# Patient Record
Sex: Male | Born: 1950 | Race: White | Hispanic: No | Marital: Married | Smoking: Current every day smoker
Health system: Southern US, Community
[De-identification: ages and names within clinical notes are randomized; demographics above are authoritative.]

## PROBLEM LIST (undated history)

## (undated) DIAGNOSIS — T8859XA Other complications of anesthesia, initial encounter: Secondary | ICD-10-CM

## (undated) DIAGNOSIS — M5137 Other intervertebral disc degeneration, lumbosacral region: Secondary | ICD-10-CM

## (undated) DIAGNOSIS — R51 Headache: Secondary | ICD-10-CM

## (undated) DIAGNOSIS — F419 Anxiety disorder, unspecified: Secondary | ICD-10-CM

## (undated) DIAGNOSIS — M51379 Other intervertebral disc degeneration, lumbosacral region without mention of lumbar back pain or lower extremity pain: Secondary | ICD-10-CM

## (undated) DIAGNOSIS — K219 Gastro-esophageal reflux disease without esophagitis: Secondary | ICD-10-CM

## (undated) DIAGNOSIS — I10 Essential (primary) hypertension: Secondary | ICD-10-CM

## (undated) DIAGNOSIS — R519 Headache, unspecified: Secondary | ICD-10-CM

## (undated) DIAGNOSIS — E785 Hyperlipidemia, unspecified: Secondary | ICD-10-CM

## (undated) HISTORY — DX: Hyperlipidemia, unspecified: E78.5

## (undated) HISTORY — PX: HERNIA REPAIR: SHX51

## (undated) HISTORY — DX: Essential (primary) hypertension: I10

## (undated) HISTORY — PX: TONSILLECTOMY AND ADENOIDECTOMY: SHX28

## (undated) HISTORY — PX: EYE SURGERY: SHX253

---

## 2004-09-21 ENCOUNTER — Emergency Department (HOSPITAL_COMMUNITY): Admission: EM | Admit: 2004-09-21 | Discharge: 2004-09-21 | Payer: Self-pay | Admitting: Emergency Medicine

## 2006-12-13 ENCOUNTER — Ambulatory Visit: Payer: Self-pay | Admitting: Ophthalmology

## 2007-09-15 ENCOUNTER — Ambulatory Visit: Payer: Self-pay | Admitting: Internal Medicine

## 2009-05-18 ENCOUNTER — Emergency Department: Payer: Self-pay | Admitting: Emergency Medicine

## 2009-12-09 ENCOUNTER — Encounter: Admission: RE | Admit: 2009-12-09 | Discharge: 2009-12-09 | Payer: Self-pay | Admitting: Internal Medicine

## 2011-03-31 ENCOUNTER — Other Ambulatory Visit: Payer: Self-pay | Admitting: Internal Medicine

## 2011-03-31 DIAGNOSIS — M79604 Pain in right leg: Secondary | ICD-10-CM

## 2011-03-31 DIAGNOSIS — M545 Low back pain: Secondary | ICD-10-CM

## 2011-04-06 ENCOUNTER — Ambulatory Visit
Admission: RE | Admit: 2011-04-06 | Discharge: 2011-04-06 | Disposition: A | Discharge status: Home / Self Care | Payer: BC Managed Care – PPO | Source: Ambulatory Visit | Attending: Internal Medicine | Admitting: Internal Medicine

## 2011-04-06 DIAGNOSIS — M545 Low back pain: Secondary | ICD-10-CM

## 2011-04-06 DIAGNOSIS — M79604 Pain in right leg: Secondary | ICD-10-CM

## 2011-04-25 HISTORY — PX: SPINE SURGERY: SHX786

## 2011-05-12 ENCOUNTER — Encounter (HOSPITAL_COMMUNITY)
Admission: RE | Admit: 2011-05-12 | Discharge: 2011-05-12 | Disposition: A | Discharge status: Home / Self Care | Payer: BC Managed Care – PPO | Source: Ambulatory Visit | Attending: Neurosurgery | Admitting: Neurosurgery

## 2011-05-12 ENCOUNTER — Other Ambulatory Visit (HOSPITAL_COMMUNITY): Payer: Self-pay | Admitting: Neurosurgery

## 2011-05-12 ENCOUNTER — Ambulatory Visit (HOSPITAL_COMMUNITY)
Admission: RE | Admit: 2011-05-12 | Discharge: 2011-05-12 | Disposition: A | Discharge status: Home / Self Care | Payer: BC Managed Care – PPO | Source: Ambulatory Visit | Attending: Neurosurgery | Admitting: Neurosurgery

## 2011-05-12 DIAGNOSIS — Z01812 Encounter for preprocedural laboratory examination: Secondary | ICD-10-CM | POA: Insufficient documentation

## 2011-05-12 DIAGNOSIS — M5126 Other intervertebral disc displacement, lumbar region: Secondary | ICD-10-CM | POA: Insufficient documentation

## 2011-05-12 DIAGNOSIS — Z01818 Encounter for other preprocedural examination: Secondary | ICD-10-CM | POA: Insufficient documentation

## 2011-05-12 DIAGNOSIS — Z01810 Encounter for preprocedural cardiovascular examination: Secondary | ICD-10-CM | POA: Insufficient documentation

## 2011-05-12 LAB — BASIC METABOLIC PANEL
BUN: 14 mg/dL (ref 6–23)
CO2: 30 mEq/L (ref 19–32)
Calcium: 8.8 mg/dL (ref 8.4–10.5)
Chloride: 105 mEq/L (ref 96–112)
Creatinine, Ser: 1.05 mg/dL (ref 0.50–1.35)
GFR calc Af Amer: >60 mL/min (ref >60)
GFR calc non Af Amer: >60 mL/min (ref >60)
Glucose, Bld: 89 mg/dL (ref 70–99)
Potassium: 4.3 mEq/L (ref 3.5–5.1)
Sodium: 140 mEq/L (ref 135–145)

## 2011-05-12 LAB — SURGICAL PCR SCREEN
MRSA, PCR: NEGATIVE
Staphylococcus aureus: NEGATIVE

## 2011-05-12 LAB — CBC
HCT: 38.6 % — ABNORMAL LOW (ref 39.0–52.0)
Hemoglobin: 13.5 g/dL (ref 13.0–17.0)
MCH: 31.5 pg (ref 26.0–34.0)
MCHC: 35 g/dL (ref 30.0–36.0)
MCV: 90 fL (ref 78.0–100.0)
Platelets: 205 10*3/uL (ref 150–400)
RBC: 4.29 MIL/uL (ref 4.22–5.81)
RDW: 13.4 % (ref 11.5–15.5)
WBC: 9.7 10*3/uL (ref 4.0–10.5)

## 2011-05-12 LAB — TYPE AND SCREEN
ABO/RH(D): A POS
Antibody Screen: NEGATIVE

## 2011-05-12 LAB — ABO/RH: ABO/RH(D): A POS

## 2011-05-13 ENCOUNTER — Inpatient Hospital Stay (HOSPITAL_COMMUNITY): Payer: BC Managed Care – PPO

## 2011-05-13 ENCOUNTER — Observation Stay (HOSPITAL_COMMUNITY)
Admission: RE | Admit: 2011-05-13 | Discharge: 2011-05-14 | Disposition: A | Discharge status: Home / Self Care | Payer: BC Managed Care – PPO | Source: Ambulatory Visit | Attending: Neurosurgery | Admitting: Neurosurgery

## 2011-05-13 DIAGNOSIS — G834 Cauda equina syndrome: Secondary | ICD-10-CM | POA: Insufficient documentation

## 2011-05-13 DIAGNOSIS — M48062 Spinal stenosis, lumbar region with neurogenic claudication: Secondary | ICD-10-CM | POA: Insufficient documentation

## 2011-05-13 DIAGNOSIS — F17200 Nicotine dependence, unspecified, uncomplicated: Secondary | ICD-10-CM | POA: Insufficient documentation

## 2011-05-13 DIAGNOSIS — Z01810 Encounter for preprocedural cardiovascular examination: Secondary | ICD-10-CM | POA: Insufficient documentation

## 2011-05-13 DIAGNOSIS — Z01818 Encounter for other preprocedural examination: Secondary | ICD-10-CM | POA: Insufficient documentation

## 2011-05-13 DIAGNOSIS — M5106 Intervertebral disc disorders with myelopathy, lumbar region: Secondary | ICD-10-CM | POA: Insufficient documentation

## 2011-05-13 DIAGNOSIS — I10 Essential (primary) hypertension: Secondary | ICD-10-CM | POA: Insufficient documentation

## 2011-05-13 DIAGNOSIS — Z01812 Encounter for preprocedural laboratory examination: Secondary | ICD-10-CM | POA: Insufficient documentation

## 2011-05-13 DIAGNOSIS — M4716 Other spondylosis with myelopathy, lumbar region: Principal | ICD-10-CM | POA: Insufficient documentation

## 2011-05-17 NOTE — Op Note (Signed)
NAME:  Jose Kline, Jose Kline NO.:  [id number]  MEDICAL RECORD NO.:  [id number]  LOCATION:  3534                         FACILITY:  [facility]  PHYSICIAN:  Hewitt Shorts, M.D.DATE OF BIRTH:  02/12/1951  DATE OF PROCEDURE:  05/13/2011 DATE OF DISCHARGE:                              OPERATIVE REPORT   PREOPERATIVE DIAGNOSES:  Lumbar stenosis, lumbar spondylosis, lumbar degenerative disk disease, neurogenic claudication, and cauda equina syndrome.  POSTOPERATIVE DIAGNOSES:  Lumbar stenosis, lumbar spondylosis, lumbar degenerative disk disease, neurogenic claudication, and cauda equina syndrome.  PROCEDURE:  L1 and L2 decompressive lumbar laminectomy with decompression of the exiting L1-L2 nerve roots and decompression extending laterally including facetectomy and foraminotomy with decompression beyond that required for interbody arthrodesis, L1-2 posterior lumbar interbody arthrodesis with AVS PEEK interbody implant with Vitoss with bone marrow aspirate and Infuse and L1-2 posterolateral arthrodesis with radius posterior screw instrumentation, Vitoss with bone marrow aspirate and Infuse.  SURGEON:  Hewitt Shorts, MD  ASSISTANT:  Coletta Memos, MD  ANESTHESIA:  General endotracheal.  INDICATIONS:  The patient is a 60 year old man who presented with neurogenic claudication with pain in the low back extending down through the buttocks, thighs, and legs bilaterally.  He also had slow urinary stream, changes in his bowel habits, and erectile dysfunction consistent with chronic cauda equina syndrome and decision was made to proceed with decompression and stabilization.  PROCEDURE:  The patient was brought to the operating room and placed under general anesthesia.  The patient was turned to prone position. Lumbar region was prepped with Betadine soap and solution and draped in a sterile fashion.  The midline was infiltrated with local anesthetic with epinephrine.   An x-ray was taken and the L1-2 level was identified. A midline incision was made over the L1-2 level and carried down through the subcutaneous tissue.  Bipolar electrocautery was used to maintain hemostasis.  Dissection was carried down to the lumbar fascia which was incised bilaterally and the paraspinal muscles were dissected from the spinous process and lamina in a subperiosteal fashion.  Self-retaining retractors were placed and x-ray was taken and we identified the L1-2 level.  Laminectomy was performed using double-action rongeurs, high-speed drill, and Kerrison punches.  There was significant ligamentum flavum thickening that was carefully removed and we were able to extend the decompression laterally, rostrally, and caudally as needed and good decompression of the thecal sac was achieved.  Facetectomy was performed and we examined and decompressed the neural foramina bilaterally and identified the exiting L1 and L2 nerve roots to ensure that there were decompressed.  Then with microdissection and microsurgical technique, we proceeded with bilateral diskectomy at the L1-2 level.  The disk was quite degenerated. The annulus was incised.  Disk space was entered and was quite narrowed. We were able to progressively remove the posterior spondylotic overgrowth and then continued diskectomy.  We were then able to gently distract the disk space and continued diskectomy laterally into the lateral portion of the disk space.  We then began to prepare the endplates using curettes to remove the cartilaginous endplate surfaces and measured the height of the intervertebral disk space and selected 7-mm PEEK interbody implants.  The  C-arm fluoroscope was then draped and brought onto the field to guide our placement of pedicle screws at L1 and L2.  Pedicle entry sites were identified bilaterally at each level.  Each of the pedicles was probed.  Bone marrow was aspirated from the vertebral  bodies and injected over 10 mL strip of Vitoss and then each of the pedicles was examined with a ball probe.  Good bony surfaces were found.  Each was tapped with a 4.25-mm tap.  Good threading was found and then we placed 4.75 mm screws bilaterally at each level using 45 mm screws at L1 and 40 mm screws at L2.  Once all four screws were placed, we packed the interbody implants with a combination of Vitoss bone marrow aspirate and Infuse and placed a distracting sizer on the left side and gently retracted the thecal sac and nerve root medially and gently tamped the first implant on the right side which was countersunk.  We then packed the midline of the disk space with small pledget of Infuse and Vitoss with bone marrow aspirate and then again retracted the thecal sac and nerve root medially, gently tamped the second implant on the left side. We then packed additional Vitoss with bone marrow aspirate lateral to each of the implants and dorsal to the implants within the disk space.  We then packed the lateral gutters with Infuse and Vitoss with bone marrow aspirate.  We had previously exposed and decorticated the transverse process of L1 and L2 bilaterally.  We selected 40-mm prelordosed rods.  They were placed in the screw head and secured with locking caps and once all four locking caps were placed, final tightening was performed.  We then selected a medium cross- link, it was secured to the rods and then the lock in the middle was tightened and secured.  It was irrigated on numerous occasions through the procedure, first with saline solution and bacitracin solution.  Good hemostasis was established and confirmed and then we proceeded with closure.  Deep fascia was closed with interrupted undyed #1 Vicryl sutures.  Scarpa fascia was interrupted undyed 2-0 Vicryl sutures. Subcutaneous and subcuticular were closed with inverted 2-0 and 3-0 undyed Vicryl sutures.  Skin was reapproximated  with Dermabond.  The wound was dressed with sterile gauze and 4-inch Hypafix.  Procedure was tolerated well.  The estimated blood loss was 200 mL.  Sponge count correct.  We did use of Cell Saver during the procedure, but the Cell Saver technician did not feel there was sufficient blood loss to process the collected blood.  Following surgery, the patient was returned back to supine position, reversed from the anesthetic, extubated, and transferred to the recovery room for further care.     Hewitt Shorts, M.D.     RWN/MEDQ  D:  05/13/2011  T:  05/14/2011  Job:  [id number]  Electronically Signed by Shirlean Kelly M.D. on 05/17/2011 09:47:55 AM

## 2012-05-12 ENCOUNTER — Encounter (INDEPENDENT_AMBULATORY_CARE_PROVIDER_SITE_OTHER): Payer: Self-pay | Admitting: General Surgery

## 2012-05-12 ENCOUNTER — Ambulatory Visit (INDEPENDENT_AMBULATORY_CARE_PROVIDER_SITE_OTHER): Payer: BC Managed Care – PPO | Admitting: General Surgery

## 2012-05-12 VITALS — BP 115/80 | HR 66 | Temp 97.0°F | Ht 67.0 in | Wt 194.6 lb

## 2012-05-12 DIAGNOSIS — K429 Umbilical hernia without obstruction or gangrene: Secondary | ICD-10-CM

## 2012-05-12 NOTE — Progress Notes (Signed)
Subjective:   hernia at bellybutton  Patient ID: Jose Kline, male   DOB: [birth year], 61 y.o.   MRN: [id number]  HPI Patient is a pleasant 61 year old male referred by Dr. Su Hilt. For about 4 months he has had a gradually increasing lump at his umbilicus. This has caused intermittent pain which has been somewhat severe on the couple of occasions. He has not had any nausea or vomiting or other GI complaints. There are no particular exacerbating or relieving factors. He has not had any surgery in this area previously or any other abdominal surgery.  Past Medical History  Diagnosis Date  . Hyperlipidemia   . Hypertension    Past Surgical History  Procedure Date  . Spine surgery 04/2011   Current Outpatient Prescriptions  Medication Sig Dispense Refill  . lisinopril-hydrochlorothiazide (PRINZIDE,ZESTORETIC) 20-12.5 MG per tablet       . lovastatin (MEVACOR) 20 MG tablet        No Known Allergies History  Substance Use Topics  . Smoking status: Current Everyday Smoker -- 2.0 packs/day    Types: Cigarettes  . Smokeless tobacco: Not on file  . Alcohol Use: Yes     rare      Review of Systems  Constitutional: Negative.   HENT: Negative.   Respiratory: Negative.   Cardiovascular: Negative.   Gastrointestinal: Positive for abdominal pain. Negative for diarrhea, constipation and abdominal distention.  Genitourinary: Negative.   Musculoskeletal: Positive for back pain and arthralgias.       Objective:   Physical Exam General: Well-developed in no distress Skin: Warm and dry without rash or infection Lymph nodes: No cervical, supraclavicular, or inguinal nodes palpable Lungs: Clear without wheezing Cardiovascular: Regular rate and rhythm without murmur. No edema. Abdomen: Generally soft and nontender. There is approximately 1.5 cm slightly tender reducible hernia in the umbilicus. No other hernias. No masses or organomegaly. Extremities: No joint swelling or  deformity Neurologic: Alert and oriented. Gait normal.    Assessment:     Symptomatic umbilical hernia. We discussed options and elected to proceed with repair using mesh as an outpatient under general anesthesia. We discussed the nature of the surgery as well as risks of anesthesia, bleeding, infection, and recurrence. All questions Jose Kline.    Plan:     Repair of umbilical hernia with mesh as an outpatient under general anesthesia

## 2012-06-13 ENCOUNTER — Other Ambulatory Visit: Payer: Self-pay | Admitting: Internal Medicine

## 2012-06-13 DIAGNOSIS — R1011 Right upper quadrant pain: Secondary | ICD-10-CM

## 2012-06-19 ENCOUNTER — Ambulatory Visit
Admission: RE | Admit: 2012-06-19 | Discharge: 2012-06-19 | Disposition: A | Discharge status: Home / Self Care | Payer: BC Managed Care – PPO | Source: Ambulatory Visit | Attending: Internal Medicine | Admitting: Internal Medicine

## 2012-06-19 DIAGNOSIS — R1011 Right upper quadrant pain: Secondary | ICD-10-CM

## 2012-08-16 DIAGNOSIS — K429 Umbilical hernia without obstruction or gangrene: Secondary | ICD-10-CM

## 2012-08-22 ENCOUNTER — Telehealth (INDEPENDENT_AMBULATORY_CARE_PROVIDER_SITE_OTHER): Payer: Self-pay

## 2012-08-22 NOTE — Telephone Encounter (Signed)
Post op appt given to patient for 09/14/12 @ 9:00 w/Dr. Johna Sheriff.

## 2012-09-14 ENCOUNTER — Ambulatory Visit (INDEPENDENT_AMBULATORY_CARE_PROVIDER_SITE_OTHER): Payer: BC Managed Care – PPO | Admitting: General Surgery

## 2012-09-14 ENCOUNTER — Encounter (INDEPENDENT_AMBULATORY_CARE_PROVIDER_SITE_OTHER): Payer: Self-pay | Admitting: General Surgery

## 2012-09-14 VITALS — BP 118/70 | HR 60 | Temp 98.2°F | Resp 16 | Ht 69.25 in | Wt 192.4 lb

## 2012-09-14 DIAGNOSIS — Z09 Encounter for follow-up examination after completed treatment for conditions other than malignant neoplasm: Secondary | ICD-10-CM

## 2012-09-14 NOTE — Progress Notes (Signed)
History: Patient returns 3 weeks following repair of his umbilical hernia with a ventral patch. He is back at work. He reports no particular problems were pain.  Exam: The incision is well healed and the repair feels solid.  Assessment and plan: Doing well following umbilical hernia repair without evidence of complication. He is released to full activity and return as needed.

## 2013-11-21 ENCOUNTER — Ambulatory Visit
Admission: RE | Admit: 2013-11-21 | Discharge: 2013-11-21 | Disposition: A | Discharge status: Home / Self Care | Payer: BC Managed Care – PPO | Source: Ambulatory Visit | Attending: Internal Medicine | Admitting: Internal Medicine

## 2013-11-21 ENCOUNTER — Other Ambulatory Visit: Payer: Self-pay | Admitting: Internal Medicine

## 2013-11-21 DIAGNOSIS — R059 Cough, unspecified: Secondary | ICD-10-CM

## 2013-11-21 DIAGNOSIS — R05 Cough: Secondary | ICD-10-CM

## 2014-11-21 ENCOUNTER — Other Ambulatory Visit: Payer: Self-pay | Admitting: Internal Medicine

## 2014-11-21 DIAGNOSIS — R634 Abnormal weight loss: Secondary | ICD-10-CM

## 2014-11-21 DIAGNOSIS — R319 Hematuria, unspecified: Secondary | ICD-10-CM

## 2014-11-25 ENCOUNTER — Ambulatory Visit
Admission: RE | Admit: 2014-11-25 | Discharge: 2014-11-25 | Disposition: A | Discharge status: Home / Self Care | Payer: BC Managed Care – PPO | Source: Ambulatory Visit | Attending: Internal Medicine | Admitting: Internal Medicine

## 2014-11-25 ENCOUNTER — Other Ambulatory Visit: Payer: BC Managed Care – PPO

## 2014-11-25 DIAGNOSIS — R319 Hematuria, unspecified: Secondary | ICD-10-CM

## 2014-11-25 DIAGNOSIS — R634 Abnormal weight loss: Secondary | ICD-10-CM

## 2014-12-10 ENCOUNTER — Other Ambulatory Visit: Payer: Self-pay | Admitting: Physician Assistant

## 2014-12-10 DIAGNOSIS — R630 Anorexia: Secondary | ICD-10-CM

## 2014-12-10 DIAGNOSIS — R634 Abnormal weight loss: Secondary | ICD-10-CM

## 2014-12-10 DIAGNOSIS — R6881 Early satiety: Secondary | ICD-10-CM

## 2014-12-12 ENCOUNTER — Encounter (INDEPENDENT_AMBULATORY_CARE_PROVIDER_SITE_OTHER): Payer: Self-pay

## 2014-12-12 ENCOUNTER — Ambulatory Visit
Admission: RE | Admit: 2014-12-12 | Discharge: 2014-12-12 | Disposition: A | Discharge status: Home / Self Care | Payer: BC Managed Care – PPO | Source: Ambulatory Visit | Attending: Physician Assistant | Admitting: Physician Assistant

## 2014-12-12 DIAGNOSIS — R634 Abnormal weight loss: Secondary | ICD-10-CM

## 2014-12-12 DIAGNOSIS — R630 Anorexia: Secondary | ICD-10-CM

## 2014-12-12 DIAGNOSIS — R6881 Early satiety: Secondary | ICD-10-CM

## 2014-12-12 MED ORDER — IOHEXOL 300 MG/ML  SOLN
100.0000 mL | Freq: Once | INTRAMUSCULAR | Status: AC | PRN
  Administered 2014-12-12: 100 mL via INTRAVENOUS

## 2014-12-16 ENCOUNTER — Other Ambulatory Visit: Payer: Self-pay | Admitting: Physician Assistant

## 2014-12-16 DIAGNOSIS — K551 Chronic vascular disorders of intestine: Secondary | ICD-10-CM

## 2014-12-16 DIAGNOSIS — I771 Stricture of artery: Principal | ICD-10-CM

## 2014-12-25 ENCOUNTER — Ambulatory Visit
Admission: RE | Admit: 2014-12-25 | Discharge: 2014-12-25 | Disposition: A | Discharge status: Home / Self Care | Payer: BC Managed Care – PPO | Source: Ambulatory Visit | Attending: Physician Assistant | Admitting: Physician Assistant

## 2014-12-25 DIAGNOSIS — K551 Chronic vascular disorders of intestine: Secondary | ICD-10-CM

## 2014-12-25 DIAGNOSIS — I771 Stricture of artery: Principal | ICD-10-CM

## 2014-12-25 HISTORY — PX: IR GENERIC HISTORICAL: IMG1180011

## 2014-12-25 MED ORDER — IOHEXOL 350 MG/ML SOLN
75.0000 mL | Freq: Once | INTRAVENOUS | Status: AC | PRN
  Administered 2014-12-25: 75 mL via INTRAVENOUS

## 2014-12-25 NOTE — Consult Note (Signed)
Chief Complaint: Chief Complaint  Patient presents with  . 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  Referring Physician(s): Deliah Goody, Dr. Cristina Gong  History of Present Illness: Jose Kline is a 64 y.o. 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 For further evaluation, dedicated CTA was performed. 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 True lumen is significantly narrowed. No significant ostial stenosis to warrant percutaneous intervention. No current SMA occlusion.  jejunal branches remain patent. He currently is retired but remains very active. 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  Prior history of hypertension and hyperlipidemia. Remote history of neck and back surgeries. Remote umbilical hernia repair.   Past Medical History  Diagnosis Date  . Hyperlipidemia   . Hypertension     Past Surgical History  Procedure Laterality Date  . Spine surgery  04/2011    Allergies: Review of patient's allergies indicates no known allergies.  Medications: Prior to Admission medications   Medication Sig Start Date End Date Taking? Authorizing Provider  lisinopril-hydrochlorothiazide (PRINZIDE,ZESTORETIC) 20-12.5 MG per tablet  03/01/12  Yes Historical Provider, MD  lovastatin (MEVACOR) 20 MG tablet  04/25/12  Yes Historical Provider, MD  omeprazole (PRILOSEC) 40 MG capsule Take 40 mg by mouth daily.   Yes Historical Provider, MD     Family History  Problem Relation Age of Onset  . Cancer Mother     breast  . Kidney disease Mother     History   Social History  . Marital Status: Married    Spouse Name: N/A  . Number of Children: N/A  . Years of Education: N/A   Social History Main Topics  . Smoking status: Current Every Day Smoker -- 2.00 packs/day    Types: Cigarettes     Start date: 12/25/1966  . Smokeless tobacco: Not on file  . Alcohol Use: 0.0 oz/week    0 Standard drinks or equivalent per week     Comment: rare  . Drug Use: No  . Sexual Activity: Not on file   Other Topics Concern  . None   Social History Narrative    Review of Systems: A 12 point ROS discussed and pertinent positives are indicated in the HPI above.  All other systems are negative.  Review of Systems  Constitutional: Positive for appetite change. Negative for fever, activity change and fatigue.  Respiratory: Negative for cough, chest tightness and shortness of breath.   Cardiovascular: Negative for chest pain and palpitations.  Gastrointestinal: Negative for abdominal distention.    Vital Signs: BP 143/85 mmHg  Pulse 57  Temp(Src) 97.5 F (36.4 C) (Oral)  Resp 12  SpO2 98%  Physical Exam  Constitutional: He appears well-developed and well-nourished. No distress.  Neck: No JVD present. No tracheal deviation present. No thyromegaly present.  Negative for carotid bruit.  Cardiovascular: Normal rate, regular rhythm, normal heart sounds and intact distal pulses.  Exam reveals no friction rub.   No murmur heard. Pulmonary/Chest: Effort normal. No respiratory distress. He has wheezes. He exhibits no tenderness.  Abdominal: Soft. Bowel sounds are normal. He exhibits no distension.  No recurrent umbilical hernia.  Skin: Skin is warm and dry. No rash noted. He is not diaphoretic. No erythema. No pallor.  Psychiatric: He has a normal mood and affect. His behavior  is normal. Judgment and thought content normal.    Mallampati Score:   1  Imaging: US Abdomen Complete  11/25/2014   CLINICAL DATA:  Weight loss, blood in urine  EXAM: ULTRASOUND ABDOMEN COMPLETE  COMPARISON:  None.  FINDINGS: Gallbladder: No gallstones or wall thickening visualized. No sonographic Murphy sign noted.  Common bile duct: Diameter: 3.7 mm  Liver: No focal lesion identified. Within normal limits in  parenchymal echogenicity.  IVC: No abnormality visualized.  Pancreas: Visualized portion unremarkable.  Spleen: Size and appearance within normal limits.  Right Kidney: Length: 11.9 cm. Echogenicity within normal limits. No mass or hydronephrosis visualized.  Left Kidney: Length: 10.6 cm. Echogenicity within normal limits. No mass or hydronephrosis visualized.  Abdominal aorta: No aneurysm visualized.  Other findings: No ascites is demonstrated.  IMPRESSION: Normal abdominal ultrasound examination. No renal abnormalities are demonstrated.   Electronically Signed   By: David  Martinique   On: 11/25/2014 11:15   US Pelvis Limited  11/25/2014   CLINICAL DATA:  Hematuria and weight loss  EXAM: LIMITED ULTRASOUND OF PELVIS  TECHNIQUE: Limited transabdominal ultrasound examination of the pelvis was performed.  COMPARISON:  None.  FINDINGS: The urinary bladder is adequately distended. No intraluminal mass is demonstrated. There is no wall thickening or other mural abnormalities.  IMPRESSION: Normal ultrasound limited to the urinary bladder.   Electronically Signed   By: David  Martinique   On: 11/25/2014 11:11   Ct Abdomen Pelvis W Contrast  12/12/2014   CLINICAL DATA:  One month of weight loss and early satiety. Upper abdominal pain.  EXAM: CT ABDOMEN AND PELVIS WITH CONTRAST  TECHNIQUE: Multidetector CT imaging of the abdomen and pelvis was performed using the standard protocol following bolus administration of intravenous contrast.  CONTRAST:  166mL OMNIPAQUE IOHEXOL 300 MG/ML  SOLN  COMPARISON:  None.  FINDINGS: BODY WALL: There is a fatty right inguinal hernia. Status post umbilical hernia repair with mesh.  LOWER CHEST: Unremarkable.  ABDOMEN/PELVIS:  Liver: Small hypervascular focus in the upper left liver, likely a sub cm shunt. There is no indication of cirrhosis.  Biliary: No evidence of biliary obstruction or stone.  Pancreas: Unremarkable.  Spleen: Unremarkable.  Adrenals: Unremarkable.  Kidneys and ureters: There  is a 11 mm mildly dense nodule exophytic from the upper pole left kidney. Although indeterminate on this examination, based on contrast enhanced lumbar spine MRI 09/13/2014 this appears nonenhancing and most compatible with a hemorrhagic or proteinaceous cyst.  Bladder: Unremarkable.  Reproductive: Unremarkable.  Bowel: No obstruction. No wall thickening or congestion.  Retroperitoneum: No mass or adenopathy.  Peritoneum: No ascites or pneumoperitoneum.  Vascular: There is poor flow within the SMA, beginning before the first branching. The appearance is not typical of a focal stenosis, and there may have been prior dissection or diffuse thick atheromatous plaque. A thin crescent of flow is visible in the posterior lumen and absent or poor flow present anteriorly. Contrast timing and streak artifact limits further assessment. There is no indication of bowel ischemia. Extensive atherosclerosis. No aneurysm.  OSSEOUS: Status post L1-2 discectomy with posterior rod and pedicle screw fixation. There is complete interbody fusion. Degenerative disc disease is advanced the lumbar spine, especially at L5-S1.  These results will be called to the ordering clinician or representative by the Radiologist Assistant, and communication documented in the PACS or zVision Dashboard.  IMPRESSION: 1. 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 This could explain the patient history, suggest mesenteric  CTA and interventional radiology clinic referral. 2. Incidental findings are noted above.   Electronically Signed   By: Monte Fantasia M.D.   On: 12/12/2014 09:50   Ct Angio Abd/pel W/ And/or W/o  12/25/2014   CLINICAL DATA:  64 year old male with intermittent left-sided abdominal pain and 20 lb weight loss over the past several months. Evaluate for superior mesenteric artery stenosis. Patient also complains of intermittent diarrhea and constipation.  EXAM: CTA ABDOMEN AND PELVIS wITHOUT  AND WITH CONTRAST  TECHNIQUE: Multidetector CT imaging of the abdomen and pelvis was performed using the standard protocol during bolus administration of intravenous contrast. Multiplanar reconstructed images and MIPs were obtained and reviewed to evaluate the vascular anatomy.  CONTRAST:  35mL OMNIPAQUE IOHEXOL 350 MG/ML SOLN  COMPARISON:  CT abdomen/ pelvis 12/12/2014  FINDINGS: VASCULAR  Aorta: Normal caliber abdominal aorta. There is fairly mild heterogeneous atherosclerotic plaque in the infrarenal segment. Small cluster of focal penetrating ulcers are noted anteriorly from the 11 to 1 o'clock position just above the IMA origin. No aneurysmal dilatation or dissection.  Celiac: Widely patent. Conventional hepatic arterial anatomy. No evidence of visceral artery aneurysm.  SMA: The origin the superior mesenteric artery is widely patent. There is mild fibro fatty plaque approximately 1 cm from the arch origin which results in minimal if any narrowing. However, approximately 1 cm beyond this plaque the vessel remains opacified but irregular with crescentic thrombus in the vessel wall peripherally. After the first 2 main a jejunal branches, the vessel becomes significantly narrowed with the lumen of only 2-3 mm. The origin of the middle colic artery is narrowed. The vessel resumes normal caliber at the origin of the right and ileocolic arteries. The narrowed segment extends for approximately 3- 3.5 cm. 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  Renals: Single renal arteries bilaterally. No evidence of significant stenosis, dissection or aneurysm.  IMA: Moderate narrowing of the origin of the IMA secondary to noncalcified fibro fatty plaque. The remainder the vessel is unremarkable.  Inflow: Trace atherosclerotic calcifications. The vessels remain widely patent. No aneurysmal dilatation or dissection.  Proximal Outflow: Calcified atherosclerotic plaque along the  posterior wall of the right greater than left common femoral arteries. Visualized superficial and profunda femoral arteries are widely patent and relatively disease free.  Veins: No focal venous abnormality. Hepatic, portal and renal veins are patent. SMV and I of the remain patent.  NON-VASCULAR  Lower Chest: The lung bases are clear. Visualized cardiac structures are within normal limits for size. No pericardial effusion. Unremarkable visualized distal thoracic esophagus.  Abdomen: Unremarkable CT appearance of the stomach, duodenum, spleen, adrenal glands and pancreas. There is a small calcified peripancreatic node versus small duodenum diverticulum. Normal hepatic contour and morphology. No discrete hepatic lesion. Gallbladder is unremarkable. No intra or extrahepatic biliary ductal dilatation.  Unremarkable appearance of the bilateral kidneys. No focal solid lesion, hydronephrosis or nephrolithiasis. Stable 11 mm high attenuation proteinaceous or hemorrhagic cyst exophytic from the anteromedial aspect of the left kidney.  No focal bowel wall thickening or evidence of obstruction. Colonic diverticular disease without CT evidence of active inflammation. Normal appendix in the right lower quadrant. No free fluid or suspicious adenopathy.  Pelvis: Right larger than left omental fat containing inguinal hernias. Unremarkable bladder, prostate gland and seminal vesicles. No free fluid or suspicious adenopathy.  Bones/Soft Tissues: No acute fracture or aggressive appearing lytic or blastic osseous lesion. Surgical changes of prior L1-L2 posterior lumbar fusion  with interbody graft. Multilevel degenerative disc disease most notably at L3-L4, L4-L5 and L5-S1. Facet arthropathy is present in the lower lumbar spine at L4-L5 and L5-S1.  Review of the MIP images confirms the above findings.  IMPRESSION: VASCULAR  1. 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 There is associated narrowing of the origin of the middle colic artery. No evidence of vascular occlusion. The false lumen of the dissection is thrombosed. These findings may account for the patient's symptoms of intermittent mesenteric ischemia. 2. Irregular heterogeneous atherosclerotic plaque. Evidence of small penetrating ulcers are noted along the anterior wall of the infrarenal aorta proximal to the origin of the IMA. In combination with the findings of subacute dissection above, this suggests the patient has relatively unstable atherosclerotic plaque. Therefore, further workup and stratification for cardiac risk factors should be considered. Additionally, statin therapy may be helpful for plaque stabilization. 3. Short segment moderate to high-grade stenosis of the origin of the IMA. NON VASCULAR  1. No evidence of ischemic change involving the colon or small bowel. No focal bowel wall thickening. 2. Colonic diverticular disease without CT evidence of active inflammation. 3. Right larger than left bilateral omental fat containing inguinal hernias. 4. Multilevel degenerative disc disease and facet arthropathy in the lumbar spine. 5. Ancillary findings as above without significant interval change. Signed,  Criselda Peaches, MD  [facility]  [facility]   Electronically Signed   By: Jacqulynn Cadet M.D.   On: 12/25/2014 10:33    Assessment and Plan:  Intermittent abdominal pain and 20 pound weight loss over several months. CTA confirms a distal subacute/evolving dissection without occlusion of the jejunal or colic branches. False lumen remains occluded. No current CT signs of mesenteric ischemia .  No significant proximal disease to warrant percutaneous intervention. Findings were discussed at length with the patient  and his wife. They have a clear understanding of the SMA abnormality. Strongly recommend smoking cessation. For the subacute/evolving SMA dissection, I would recommend anticoagulation therapy either Lovenox/Coumadin versus Xarelto for 6 months. Consider follow-up CTA in 6 months.  Thank you for this interesting consult.  I greatly enjoyed meeting NASEAN ZAPF and look forward to participating in their care.  SignedGreggory Keen 12/25/2014, 10:49 AM   I spent a total of  30 Minutes in face to face in clinical consultation, greater than 50% of which was counseling/coordinating care for this patient with and SMA subacute/evolving dissection.

## 2015-11-01 IMAGING — CT CT CTA ABD/PEL W/CM AND/OR W/O CM
4 of 9 series · 11 of 36 positions shown, 15 images · IV contrast (75CC OMNI 350)
Comparison: CT abdomen/ pelvis 12/12/2014

CLINICAL DATA: 63-year-old male with intermittent left-sided
abdominal pain and 20 lb weight loss over the past several months.
Evaluate for superior mesenteric artery stenosis. Patient also
complains of intermittent diarrhea and constipation.

EXAM:
CTA ABDOMEN AND PELVIS wITHOUT AND WITH CONTRAST
TECHNIQUE: Multidetector CT imaging of the abdomen and pelvis was performed
using the standard protocol during bolus administration of
intravenous contrast. Multiplanar reconstructed images and MIPs were
obtained and reviewed to evaluate the vascular anatomy.
CONTRAST:  75mL OMNIPAQUE IOHEXOL 350 MG/ML SOLN

[Series 5: arterial (id) · axial · arterial · 0.78mm/px · z∈[-334,-64]mm · 4 of 181 slices shown]
[im 37/181  soft-tissue]
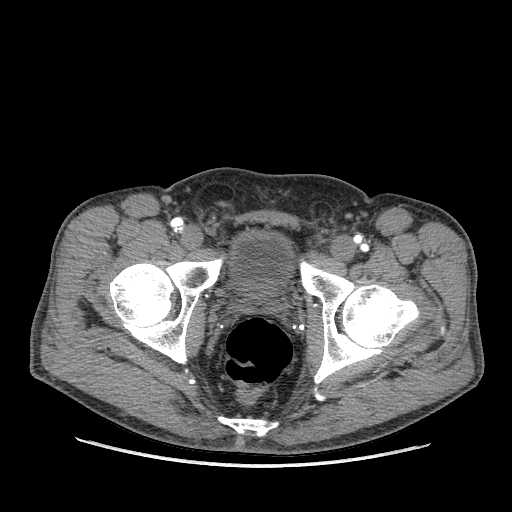
[im 73/181  soft-tissue]
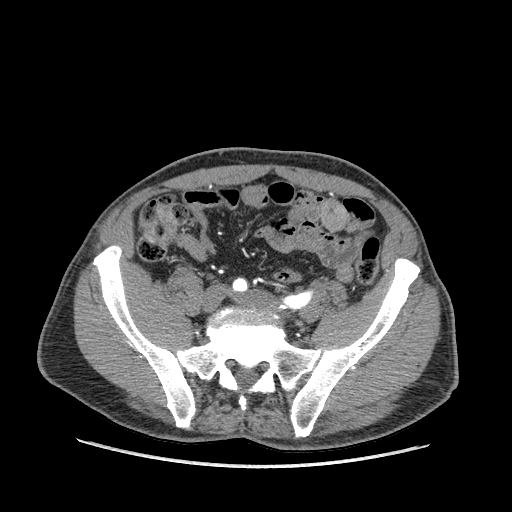
[im 109/181  soft-tissue]
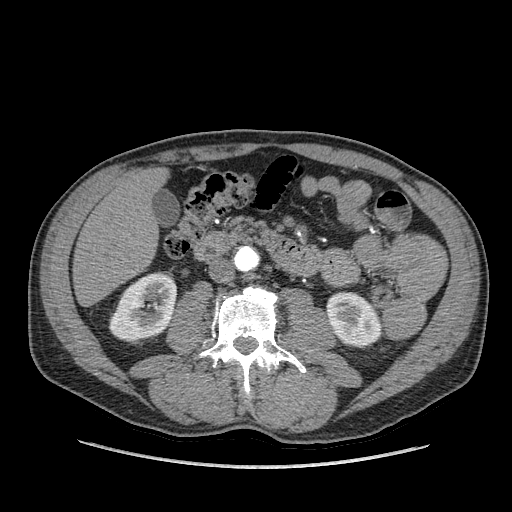
[im 145/181  soft-tissue]
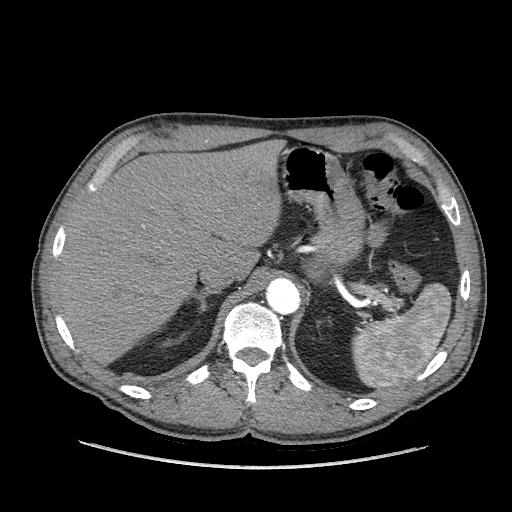

[Series 8: portal venous 5mm · axial · portal-venous · 0.78mm/px · z∈[-429,+26]mm · 3 of 90 slices shown, 7 images]
[im 1/90  soft-tissue]
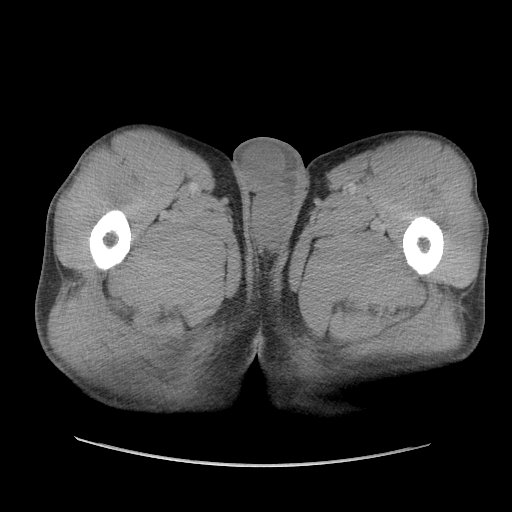
[im 1/90  lung]
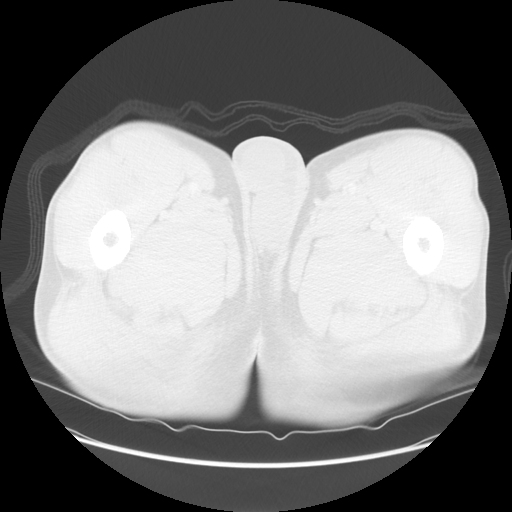
[im 1/90  bone]
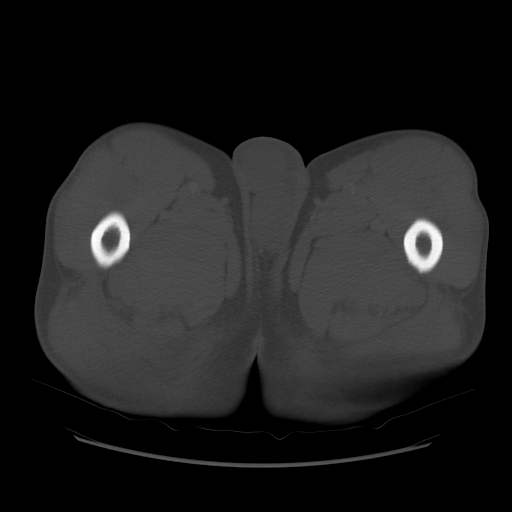
[im 45/90  soft-tissue]
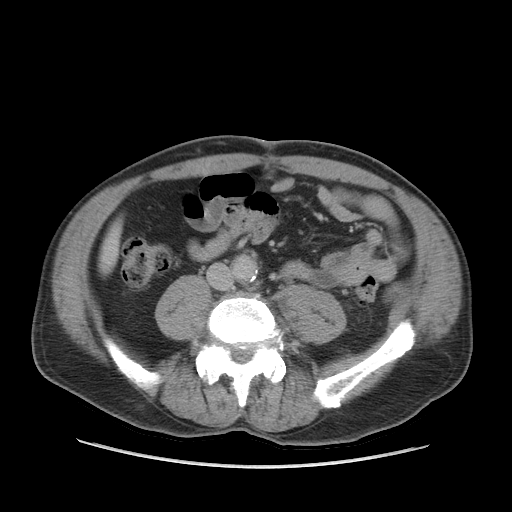
[im 45/90  lung]
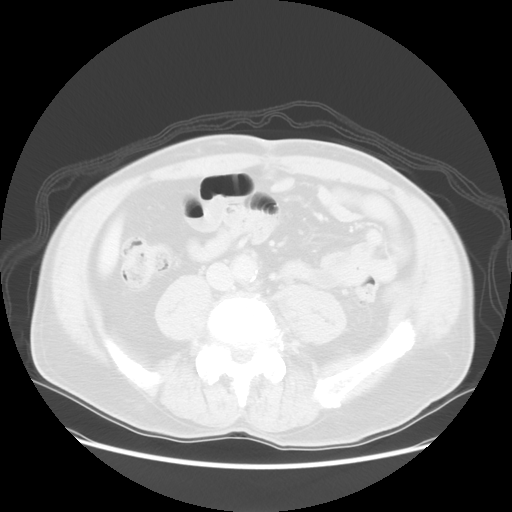
[im 90/90  soft-tissue]
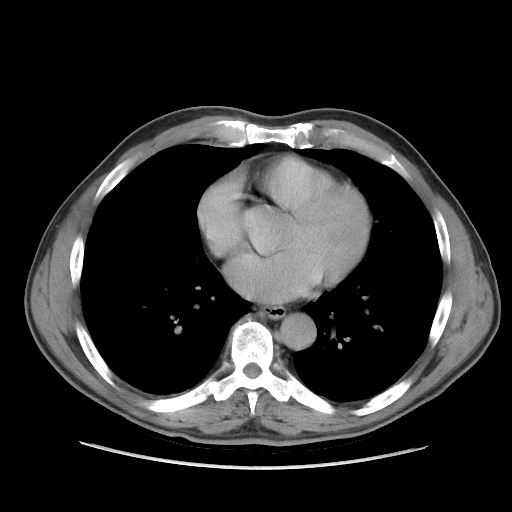
[im 90/90  lung]
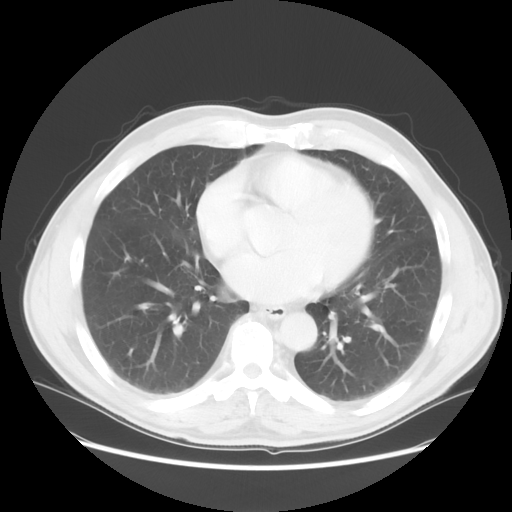

[Series 602: sagittal body · sagittal · 0.90mm/px · 3 of 150 slices shown (1 of 2)]
[im 38/150  soft-tissue]
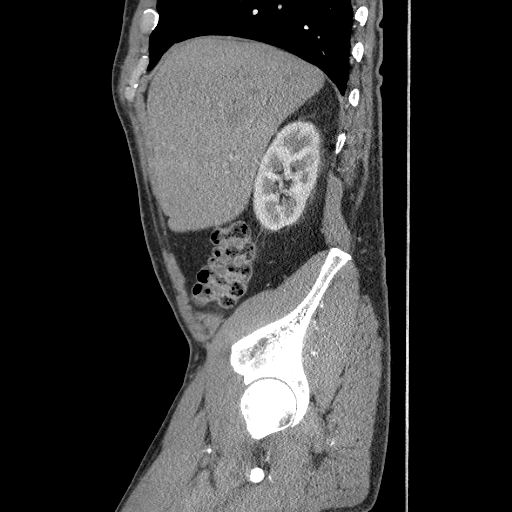
[im 75/150  soft-tissue]
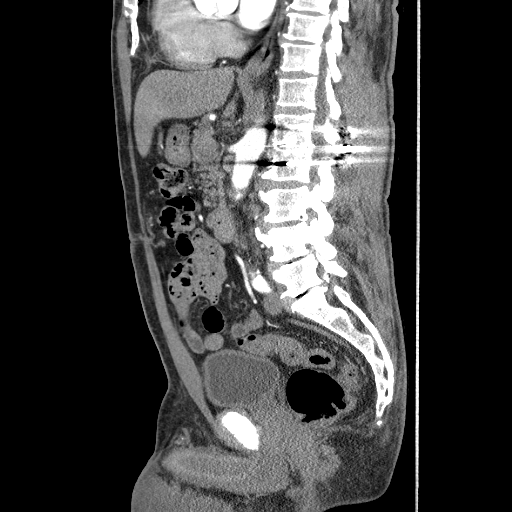
[im 112/150  soft-tissue]
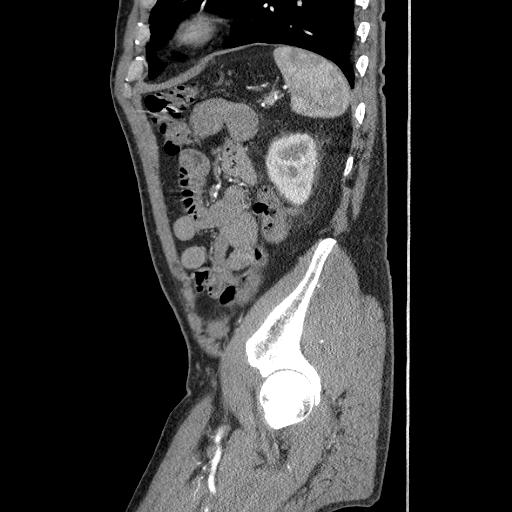

[Series 607: sagittal body · sagittal · 0.90mm/px · 1 of 150 slices shown (2 of 2)]
[im 38/150  soft-tissue]
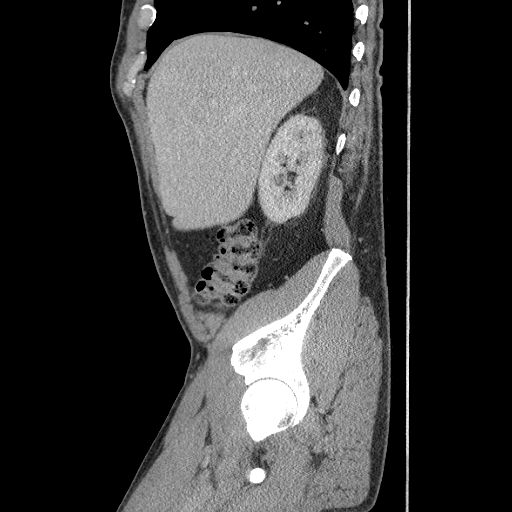

[11 of 36 positions shown; findings below may reference images not displayed]

FINDINGS: VASCULAR

Aorta: Normal caliber abdominal aorta. There is fairly mild
heterogeneous atherosclerotic plaque in the infrarenal segment.
Small cluster of focal penetrating ulcers are noted anteriorly from
the 11 to 1 o'clock position just above the IMA origin. No
aneurysmal dilatation or dissection.

Celiac: Widely patent. Conventional hepatic arterial anatomy. No
evidence of visceral artery aneurysm.

SMA: The origin the superior mesenteric artery is widely patent.
There is mild fibro fatty plaque approximately 1 cm from the arch
origin which results in minimal if any narrowing. However,
approximately 1 cm beyond this plaque the vessel remains opacified
but irregular with crescentic thrombus in the vessel wall
peripherally. After the first 2 main a jejunal branches, the vessel
becomes significantly narrowed with the lumen of only 2-3 mm. The
origin of the middle colic artery is narrowed. The vessel resumes
normal caliber at the origin of the right and ileocolic arteries.
The narrowed segment extends for approximately 3- 3.5 cm. Overall,
the findings are consistent with subacute dissection with thrombosis
of the false lumen.

Renals: Single renal arteries bilaterally. No evidence of
significant stenosis, dissection or aneurysm.

IMA: Moderate narrowing of the origin of the IMA secondary to
noncalcified fibro fatty plaque. The remainder the vessel is
unremarkable.

Inflow: Trace atherosclerotic calcifications. The vessels remain
widely patent. No aneurysmal dilatation or dissection.

Proximal Outflow: Calcified atherosclerotic plaque along the
posterior wall of the right greater than left common femoral
arteries. Visualized superficial and profunda femoral arteries are
widely patent and relatively disease free.

Veins: No focal venous abnormality. Hepatic, portal and renal veins
are patent. SMV and I of the remain patent.

NON-VASCULAR

Lower Chest: The lung bases are clear. Visualized cardiac structures
are within normal limits for size. No pericardial effusion.
Unremarkable visualized distal thoracic esophagus.

Abdomen: Unremarkable CT appearance of the stomach, duodenum,
spleen, adrenal glands and pancreas. There is a small calcified
peripancreatic node versus small duodenum diverticulum. Normal
hepatic contour and morphology. No discrete hepatic lesion.
Gallbladder is unremarkable. No intra or extrahepatic biliary ductal
dilatation.

Unremarkable appearance of the bilateral kidneys. No focal solid
lesion, hydronephrosis or nephrolithiasis. Stable 11 mm high
attenuation proteinaceous or hemorrhagic cyst exophytic from the
anteromedial aspect of the left kidney.

No focal bowel wall thickening or evidence of obstruction. Colonic
diverticular disease without CT evidence of active inflammation.
Normal appendix in the right lower quadrant. No free fluid or
suspicious adenopathy.

Pelvis: Right larger than left omental fat containing inguinal
hernias. Unremarkable bladder, prostate gland and seminal vesicles.
No free fluid or suspicious adenopathy.

Bones/Soft Tissues: No acute fracture or aggressive appearing lytic
or blastic osseous lesion. Surgical changes of prior L1-L2 posterior
lumbar fusion with interbody graft. Multilevel degenerative disc
disease most notably at L3-L4, L4-L5 and L5-S1. Facet arthropathy is
present in the lower lumbar spine at L4-L5 and L5-S1.

Review of the MIP images confirms the above findings.
IMPRESSION: VASCULAR

1. CT arteriogram findings are consistent with a subacute/evolving
dissection of the superior mesenteric artery resulting in a moderate
segment of significant vessel narrowing between the last jejunal
branch and the right colic and ileocolic branches. There is
associated narrowing of the origin of the middle colic artery. No
evidence of vascular occlusion. The false lumen of the dissection is
thrombosed. These findings may account for the patient's symptoms of
intermittent mesenteric ischemia.
2. Irregular heterogeneous atherosclerotic plaque. Evidence of small
penetrating ulcers are noted along the anterior wall of the
infrarenal aorta proximal to the origin of the IMA. In combination
with the findings of subacute dissection above, this suggests the
patient has relatively unstable atherosclerotic plaque. Therefore,
further workup and stratification for cardiac risk factors should be
considered. Additionally, statin therapy may be helpful for plaque
stabilization.
3. Short segment moderate to high-grade stenosis of the origin of
the IMA.
NON VASCULAR

1. No evidence of ischemic change involving the colon or small
bowel. No focal bowel wall thickening.
2. Colonic diverticular disease without CT evidence of active
inflammation.
3. Right larger than left bilateral omental fat containing inguinal
hernias.
4. Multilevel degenerative disc disease and facet arthropathy in the
lumbar spine.
5. Ancillary findings as above without significant interval change.

## 2015-11-26 ENCOUNTER — Ambulatory Visit
Admission: RE | Admit: 2015-11-26 | Discharge: 2015-11-26 | Disposition: A | Discharge status: Home / Self Care | Payer: BC Managed Care – PPO | Source: Ambulatory Visit | Attending: Internal Medicine | Admitting: Internal Medicine

## 2015-11-26 ENCOUNTER — Other Ambulatory Visit: Payer: Self-pay | Admitting: Internal Medicine

## 2015-11-26 DIAGNOSIS — R05 Cough: Secondary | ICD-10-CM

## 2015-11-26 DIAGNOSIS — R059 Cough, unspecified: Secondary | ICD-10-CM

## 2016-10-27 ENCOUNTER — Encounter: Payer: Self-pay | Admitting: Interventional Radiology

## 2017-12-16 ENCOUNTER — Ambulatory Visit: Payer: Self-pay | Admitting: General Surgery

## 2017-12-16 NOTE — H&P (View-Only) (Signed)
PATIENT PROFILE: Jose Kline is a 67 y.o. male who presents to the Clinic for consultation at the request of Dr. Edwina Barth for evaluation of right inguinal hernia.  PCP:  Velna Ochs, MD  HISTORY OF PRESENT ILLNESS: Jose Kline reports feeling a right inguinal hernia since 4-5 weeks ago. The patient refer hernia is causing pain on the right inguinal area. Pain does not radiates to other part of the body. Pain comes and goes by itself. When pain comes, changing position may help with the pain. Heavy lifting has made the pain worse but the pain may also present even when not being active. Denies episodes of vomiting, nausea or abdominal distention. Denies fever or chills. Denies previous feelings of inguinal hernia.    PROBLEM LIST:         Problem List  Date Reviewed: 12/12/2017         Noted   Tobacco abuse 12/12/2017   Hernia, inguinal, right 12/12/2017   Hyperlipemia, mixed 12/12/2017   Gastroesophageal reflux disease without esophagitis 12/12/2017   Erectile dysfunction 12/12/2017      GENERAL REVIEW OF SYSTEMS:   General ROS: negative for - chills, fatigue, fever, weight gain. Positive for weight loss Allergy and Immunology ROS: negative for - hives  Hematological and Lymphatic ROS: negative for - bleeding problems or bruising, negative for palpable nodes Endocrine ROS: negative for - heat or cold intolerance, hair changes Respiratory ROS: negative for - cough, shortness of breath or wheezing Cardiovascular ROS: no chest pain or palpitations GI ROS: negative for nausea, vomiting, abdominal pain, diarrhea, constipation. Positive for change in appetite Musculoskeletal ROS: negative for - joint swelling or muscle pain Neurological ROS: negative for - confusion, syncope Dermatological ROS: negative for pruritus and rash Psychiatric: negative for anxiety, depression, Positive for difficulty sleeping  MEDICATIONS: CurrentMedications        Current Outpatient  Medications  Medication Sig Dispense Refill  . ALPRAZolam (XANAX) 0.25 MG tablet Take 0.25 mg by mouth 2 (two) times daily  5  . hydroCHLOROthiazide (HYDRODIURIL) 12.5 MG tablet Take 12.5 mg by mouth once daily  1  . lovastatin (MEVACOR) 20 MG tablet Take 20 mg by mouth once daily    . omeprazole (PRILOSEC) 40 MG DR capsule Take 40 mg by mouth once daily    . sildenafil, antihypertensive, (REVATIO) 20 mg tablet Take 40 mg by mouth once daily As needed for ED  99   No current facility-administered medications for this visit.       ALLERGIES: Patient has no known allergies.  PAST MEDICAL HISTORY:     Past Medical History:  Diagnosis Date  . Anxiety, unspecified   . Depression, unspecified   . Hyperlipidemia, unspecified   . Hypertension     PAST SURGICAL HISTORY:      Past Surgical History:  Procedure Laterality Date  . ADENOIDECTOMY  1963  . INGUINAL HERNIA REPAIR  01/2014   Patient refers umbilical hernia repairs. Patient denies previous umbilical hernia repair.   FAMILY HISTORY:      Family History  Problem Relation Age of Onset  . Cancer Mother   . Diabetes type II Mother      SOCIAL HISTORY: Social History        Socioeconomic History  . Marital status: Widowed    Spouse name: Not on file  . Number of children: Not on file  . Years of education: Not on file  . Highest education level: Not on file  Occupational History  .  Not on file  Social Needs  . Financial resource strain: Not on file  . Food insecurity:    Worry: Not on file    Inability: Not on file  . Transportation needs:    Medical: Not on file    Non-medical: Not on file  Tobacco Use  . Smoking status: Current Every Day Smoker  . Smokeless tobacco: Never Used  Substance and Sexual Activity  . Alcohol use: Yes    Alcohol/week: 0.6 oz    Types: 1 Shots of liquor per week    Frequency: Monthly or less    Binge frequency: Less than monthly     Comment: occassionally  . Drug use: Never  . Sexual activity: Not on file  Lifestyle  . Physical activity:    Days per week: Not on file    Minutes per session: Not on file  . Stress: Not on file  Relationships  . Social connections:    Talks on phone: Not on file    Gets together: Not on file    Attends religious service: Not on file    Active member of club or organization: Not on file    Attends meetings of clubs or organizations: Not on file    Relationship status: Not on file  . Intimate partner violence:    Fear of current or ex partner: Not on file    Emotionally abused: Not on file    Physically abused: Not on file    Forced sexual activity: Not on file  Other Topics Concern  . Not on file  Social History Narrative  . Not on file    PHYSICAL EXAM: Vitals:   12/16/17 1322  BP: 144/84  Pulse: 61  Temp: 36.7 C (98.1 F)   Body mass index is 27.44 kg/m. Weight: 77.1 kg (170 lb)   GENERAL: Alert, active, oriented x3  HEENT: Pupils equal reactive to light. Extraocular movements are intact. Sclera clear. Palpebral conjunctiva normal red color.Pharynx clear.  NECK: Supple with no palpable mass and no adenopathy.  LUNGS: Sound clear with no rales rhonchi or wheezes.  HEART: Regular rhythm S1 and S2 without murmur.  ABDOMEN: Soft and depressible, nontender with no palpable mass, no hepatomegaly. Right inguinal hernia, reducible, minimal tenderness. Soft.    EXTREMITIES: Well-developed well-nourished symmetrical with no dependent edema.  NEUROLOGICAL: Awake alert oriented, facial expression symmetrical, moving all extremities.  REVIEW OF DATA: I have reviewed the following data today: No results found for any previous visit.   Old chart reviewed finding the evaluation of the umbilical hernia repair. No evidence of previous inguinal hernia repair. Chest Xray reviewed. No sign of pneumonia or chest pathology.    ASSESSMENT: Jose Kline is a 67 y.o. male presenting for consultation for right inguinal hernia.    The patient presents with a symptomatic, reducible inguinal hernia. Patient was oriented about the diagnosis of inguinal hernia and its implication. The patient was oriented about the treatment alternatives (observation vs surgical repair). Due to patient symptoms, repair is recommended. Patient oriented about the surgical procedure, the use of mesh and its risk of complications such as: infection, bleeding, injury to vas deference, vasculature and testicle, injury to bowel or bladder, and chronic pain. Patient also is an active smoker. Patient oriented about the importance of smoking cessation for the benefit of his health and the increased risk of surgical complications and hernia recurrence. Patient is not willing to stop smoking at this moment. Since the hernia is very symptomatic,  will proceed with hernia repair patient understating the increased risk.   PLAN: 1. Right inguinal hernia repair with mesh on 12/28/17 49505 2. CBC, CMP 3. Internal medicine clearance 4. Avoid heavy lifting or position that may cause the pain on inguinal area 5. Do not take aspirin 5 days prior to surgery.   Patient verbalized understanding, all questions were answered, and were agreeable with the plan outlined above.    Herbert Pun, MD  Electronically signed by Herbert Pun, MD

## 2017-12-16 NOTE — H&P (Signed)
PATIENT PROFILE: Jose Kline is a 67 y.o. male who presents to the Clinic for consultation at the request of Dr. Edwina Barth for evaluation of right inguinal hernia.  PCP:  Velna Ochs, MD  HISTORY OF PRESENT ILLNESS: Jose Kline reports feeling a right inguinal hernia since 4-5 weeks ago. The patient refer hernia is causing pain on the right inguinal area. Pain does not radiates to other part of the body. Pain comes and goes by itself. When pain comes, changing position may help with the pain. Heavy lifting has made the pain worse but the pain may also present even when not being active. Denies episodes of vomiting, nausea or abdominal distention. Denies fever or chills. Denies previous feelings of inguinal hernia.    PROBLEM LIST:         Problem List  Date Reviewed: 12/12/2017         Noted   Tobacco abuse 12/12/2017   Hernia, inguinal, right 12/12/2017   Hyperlipemia, mixed 12/12/2017   Gastroesophageal reflux disease without esophagitis 12/12/2017   Erectile dysfunction 12/12/2017      GENERAL REVIEW OF SYSTEMS:   General ROS: negative for - chills, fatigue, fever, weight gain. Positive for weight loss Allergy and Immunology ROS: negative for - hives  Hematological and Lymphatic ROS: negative for - bleeding problems or bruising, negative for palpable nodes Endocrine ROS: negative for - heat or cold intolerance, hair changes Respiratory ROS: negative for - cough, shortness of breath or wheezing Cardiovascular ROS: no chest pain or palpitations GI ROS: negative for nausea, vomiting, abdominal pain, diarrhea, constipation. Positive for change in appetite Musculoskeletal ROS: negative for - joint swelling or muscle pain Neurological ROS: negative for - confusion, syncope Dermatological ROS: negative for pruritus and rash Psychiatric: negative for anxiety, depression, Positive for difficulty sleeping  MEDICATIONS: CurrentMedications        Current Outpatient  Medications  Medication Sig Dispense Refill  . ALPRAZolam (XANAX) 0.25 MG tablet Take 0.25 mg by mouth 2 (two) times daily  5  . hydroCHLOROthiazide (HYDRODIURIL) 12.5 MG tablet Take 12.5 mg by mouth once daily  1  . lovastatin (MEVACOR) 20 MG tablet Take 20 mg by mouth once daily    . omeprazole (PRILOSEC) 40 MG DR capsule Take 40 mg by mouth once daily    . sildenafil, antihypertensive, (REVATIO) 20 mg tablet Take 40 mg by mouth once daily As needed for ED  99   No current facility-administered medications for this visit.       ALLERGIES: Patient has no known allergies.  PAST MEDICAL HISTORY:     Past Medical History:  Diagnosis Date  . Anxiety, unspecified   . Depression, unspecified   . Hyperlipidemia, unspecified   . Hypertension     PAST SURGICAL HISTORY:      Past Surgical History:  Procedure Laterality Date  . ADENOIDECTOMY  1963  . INGUINAL HERNIA REPAIR  01/2014   Patient refers umbilical hernia repairs. Patient denies previous umbilical hernia repair.   FAMILY HISTORY:      Family History  Problem Relation Age of Onset  . Cancer Mother   . Diabetes type II Mother      SOCIAL HISTORY: Social History        Socioeconomic History  . Marital status: Widowed    Spouse name: Not on file  . Number of children: Not on file  . Years of education: Not on file  . Highest education level: Not on file  Occupational History  .  Not on file  Social Needs  . Financial resource strain: Not on file  . Food insecurity:    Worry: Not on file    Inability: Not on file  . Transportation needs:    Medical: Not on file    Non-medical: Not on file  Tobacco Use  . Smoking status: Current Every Day Smoker  . Smokeless tobacco: Never Used  Substance and Sexual Activity  . Alcohol use: Yes    Alcohol/week: 0.6 oz    Types: 1 Shots of liquor per week    Frequency: Monthly or less    Binge frequency: Less than monthly     Comment: occassionally  . Drug use: Never  . Sexual activity: Not on file  Lifestyle  . Physical activity:    Days per week: Not on file    Minutes per session: Not on file  . Stress: Not on file  Relationships  . Social connections:    Talks on phone: Not on file    Gets together: Not on file    Attends religious service: Not on file    Active member of club or organization: Not on file    Attends meetings of clubs or organizations: Not on file    Relationship status: Not on file  . Intimate partner violence:    Fear of current or ex partner: Not on file    Emotionally abused: Not on file    Physically abused: Not on file    Forced sexual activity: Not on file  Other Topics Concern  . Not on file  Social History Narrative  . Not on file    PHYSICAL EXAM: Vitals:   12/16/17 1322  BP: 144/84  Pulse: 61  Temp: 36.7 C (98.1 F)   Body mass index is 27.44 kg/m. Weight: 77.1 kg (170 lb)   GENERAL: Alert, active, oriented x3  HEENT: Pupils equal reactive to light. Extraocular movements are intact. Sclera clear. Palpebral conjunctiva normal red color.Pharynx clear.  NECK: Supple with no palpable mass and no adenopathy.  LUNGS: Sound clear with no rales rhonchi or wheezes.  HEART: Regular rhythm S1 and S2 without murmur.  ABDOMEN: Soft and depressible, nontender with no palpable mass, no hepatomegaly. Right inguinal hernia, reducible, minimal tenderness. Soft.    EXTREMITIES: Well-developed well-nourished symmetrical with no dependent edema.  NEUROLOGICAL: Awake alert oriented, facial expression symmetrical, moving all extremities.  REVIEW OF DATA: I have reviewed the following data today: No results found for any previous visit.   Old chart reviewed finding the evaluation of the umbilical hernia repair. No evidence of previous inguinal hernia repair. Chest Xray reviewed. No sign of pneumonia or chest pathology.    ASSESSMENT: Jose Kline is a 67 y.o. male presenting for consultation for right inguinal hernia.    The patient presents with a symptomatic, reducible inguinal hernia. Patient was oriented about the diagnosis of inguinal hernia and its implication. The patient was oriented about the treatment alternatives (observation vs surgical repair). Due to patient symptoms, repair is recommended. Patient oriented about the surgical procedure, the use of mesh and its risk of complications such as: infection, bleeding, injury to vas deference, vasculature and testicle, injury to bowel or bladder, and chronic pain. Patient also is an active smoker. Patient oriented about the importance of smoking cessation for the benefit of his health and the increased risk of surgical complications and hernia recurrence. Patient is not willing to stop smoking at this moment. Since the hernia is very symptomatic,  will proceed with hernia repair patient understating the increased risk.   PLAN: 1. Right inguinal hernia repair with mesh on 12/28/17 49505 2. CBC, CMP 3. Internal medicine clearance 4. Avoid heavy lifting or position that may cause the pain on inguinal area 5. Do not take aspirin 5 days prior to surgery.   Patient verbalized understanding, all questions were answered, and were agreeable with the plan outlined above.    Herbert Pun, MD  Electronically signed by Herbert Pun, MD

## 2017-12-19 ENCOUNTER — Ambulatory Visit: Payer: Self-pay | Admitting: General Surgery

## 2017-12-22 ENCOUNTER — Encounter
Admission: RE | Admit: 2017-12-22 | Discharge: 2017-12-22 | Disposition: A | Discharge status: Home / Self Care | Payer: Medicare Other | Source: Ambulatory Visit | Attending: General Surgery | Admitting: General Surgery

## 2017-12-22 ENCOUNTER — Other Ambulatory Visit: Payer: Self-pay

## 2017-12-22 DIAGNOSIS — Z01818 Encounter for other preprocedural examination: Secondary | ICD-10-CM | POA: Insufficient documentation

## 2017-12-22 HISTORY — DX: Gastro-esophageal reflux disease without esophagitis: K21.9

## 2017-12-22 HISTORY — DX: Headache: R51

## 2017-12-22 HISTORY — DX: Anxiety disorder, unspecified: F41.9

## 2017-12-22 HISTORY — DX: Other intervertebral disc degeneration, lumbosacral region: M51.37

## 2017-12-22 HISTORY — DX: Headache, unspecified: R51.9

## 2017-12-22 HISTORY — DX: Other intervertebral disc degeneration, lumbosacral region without mention of lumbar back pain or lower extremity pain: M51.379

## 2017-12-22 NOTE — Patient Instructions (Addendum)
Your procedure is scheduled on: Thursday, MARCH 7  Report to Index  To find out your arrival time please call [phone] between 1PM - 3PM on Wednesday, MARCH 6  Remember: Instructions that are not followed completely may result in serious medical risk,  up to and including death, or upon the discretion of your surgeon and anesthesiologist  your surgery may need to be rescheduled.     _X__ 1. Do not eat food after midnight the night before your procedure.                 No gum chewing, lozengers or hard candies.                  You may drink clear liquids up to 2 hours                 before you are scheduled to arrive for your surgery-                   DO not drink clear liquids within 2 hours of the start of your surgery.                  Clear Liquids include:  water, apple juice without pulp, clear carbohydrate                 drink such as Clearfast of Gartorade, Black Coffee or Tea (Do not add                 anything to coffee or tea).  __X__2.  On the morning of surgery brush your teeth with toothpaste and water,                              you may rinse your mouth with mouthwash if you wish.                                    Do not swallow any toothpaste of mouthwash.     _X__ 3.  No Alcohol for 24 hours before or after surgery.   _X__ 4.  Do Not Smoke or use e-cigarettes For 24 Hours Prior to Your Surgery.                 Do not use any chewable tobacco products for at least 6 hours prior to                 surgery.  ____  5.  Bring all medications with you on the day of surgery if instructed.   ____  6.  Notify your doctor if there is any change in your medical condition      (cold, fever, infections).     Do not wear jewelry, make-up, hairpins, clips or nail polish. Do not wear lotions, powders, or perfumes. You may wear deodorant. Do not shave 48 hours prior to surgery. Men may shave face and neck. Do  not bring valuables to the hospital.    [facility] is not responsible for any belongings or valuables.  Contacts, dentures or bridgework may not be worn into surgery. Leave your suitcase in the car. After surgery it may be brought to your room. For patients admitted to the hospital, discharge time is determined by your treatment team.   Patients discharged the  day of surgery will not be allowed to drive home.   Please read over the following fact sheets that you were given:   PREPARING FOR SURGERY   ____ Take these medicines the morning of surgery with A SIP OF WATER:    1. PRILOSEC, TAKE THE NIGHT BEFORE SURGERY AND THE                             MORNING OF SURGERY   2. XANAX, IF NEEDED  3.   4.  5.  6.  ____ Fleet Enema (as directed)   __X__ Use CHG Soap as directed. IF UNABLE TO TOLERATE THIS SOAP,                   PLEASE USE REGULAR SOAP TO SHOWER.  ____ Use inhalers on the day of surgery  _X___ Stop ALL ASPIRIN PRODUCTS NOW!!                THIS INCLUDES EXCEDRIN, GOODYS POWDER, BC POWDER  _X___ Stop Anti-inflammatories NOW!!                     THIS INCLUDES IBUPROFEN / MOTRIN /ADVIL / ALEVE / NAPROSYN   ____ Stop supplements until after surgery.    ____ Bring C-Pap to the hospital.   HAVE STOOL SOFTENERS AT [place] [facility] FOR SUPPORT OF YOUR GROIN

## 2017-12-26 NOTE — Pre-Procedure Instructions (Addendum)
CLEARED AS OPTIMIZED FOR SURGERY BY DR Wynetta Emery 12/23/17 EKG IN 12/22/17 NOTE READ EKG AS NSR

## 2017-12-28 MED ORDER — CEFAZOLIN SODIUM-DEXTROSE 2-4 GM/100ML-% IV SOLN
2.0000 g | INTRAVENOUS | Status: AC
  Administered 2017-12-29: 2 g via INTRAVENOUS

## 2017-12-29 ENCOUNTER — Ambulatory Visit: Payer: Medicare Other | Admitting: Anesthesiology

## 2017-12-29 ENCOUNTER — Other Ambulatory Visit: Payer: Self-pay

## 2017-12-29 ENCOUNTER — Ambulatory Visit
Admission: RE | Admit: 2017-12-29 | Discharge: 2017-12-29 | Disposition: A | Discharge status: Home / Self Care | Payer: Medicare Other | Source: Ambulatory Visit | Attending: General Surgery | Admitting: General Surgery

## 2017-12-29 ENCOUNTER — Encounter
Admission: RE | Disposition: A | Discharge status: Home / Self Care | Payer: Self-pay | Source: Ambulatory Visit | Attending: General Surgery

## 2017-12-29 ENCOUNTER — Encounter: Payer: Self-pay | Admitting: *Deleted

## 2017-12-29 DIAGNOSIS — K4090 Unilateral inguinal hernia, without obstruction or gangrene, not specified as recurrent: Secondary | ICD-10-CM | POA: Insufficient documentation

## 2017-12-29 DIAGNOSIS — E782 Mixed hyperlipidemia: Secondary | ICD-10-CM | POA: Diagnosis not present

## 2017-12-29 DIAGNOSIS — K219 Gastro-esophageal reflux disease without esophagitis: Secondary | ICD-10-CM | POA: Diagnosis not present

## 2017-12-29 DIAGNOSIS — F419 Anxiety disorder, unspecified: Secondary | ICD-10-CM | POA: Diagnosis not present

## 2017-12-29 DIAGNOSIS — Z79899 Other long term (current) drug therapy: Secondary | ICD-10-CM | POA: Diagnosis not present

## 2017-12-29 DIAGNOSIS — F17200 Nicotine dependence, unspecified, uncomplicated: Secondary | ICD-10-CM | POA: Diagnosis not present

## 2017-12-29 DIAGNOSIS — I10 Essential (primary) hypertension: Secondary | ICD-10-CM | POA: Diagnosis not present

## 2017-12-29 DIAGNOSIS — N529 Male erectile dysfunction, unspecified: Secondary | ICD-10-CM | POA: Diagnosis not present

## 2017-12-29 DIAGNOSIS — D176 Benign lipomatous neoplasm of spermatic cord: Secondary | ICD-10-CM | POA: Insufficient documentation

## 2017-12-29 HISTORY — PX: INSERTION OF MESH: SHX5868

## 2017-12-29 HISTORY — PX: INGUINAL HERNIA REPAIR: SHX194

## 2017-12-29 SURGERY — REPAIR, HERNIA, INGUINAL, ADULT
Anesthesia: General | Laterality: Right

## 2017-12-29 MED ORDER — LIDOCAINE HCL (CARDIAC) 20 MG/ML IV SOLN
INTRAVENOUS | Status: DC | PRN
  Administered 2017-12-29: 100 mg via INTRAVENOUS

## 2017-12-29 MED ORDER — ACETAMINOPHEN 10 MG/ML IV SOLN
INTRAVENOUS | Status: DC | PRN
  Administered 2017-12-29: 1000 mg via INTRAVENOUS

## 2017-12-29 MED ORDER — TRAMADOL HCL 50 MG PO TABS
ORAL_TABLET | ORAL | Status: AC
  Filled 2017-12-29: qty 1

## 2017-12-29 MED ORDER — LIDOCAINE HCL (PF) 2 % IJ SOLN
INTRAMUSCULAR | Status: AC
  Filled 2017-12-29: qty 10

## 2017-12-29 MED ORDER — PROPOFOL 10 MG/ML IV BOLUS
INTRAVENOUS | Status: AC
  Filled 2017-12-29: qty 20

## 2017-12-29 MED ORDER — BUPIVACAINE-EPINEPHRINE (PF) 0.25% -1:200000 IJ SOLN
INTRAMUSCULAR | Status: AC
  Filled 2017-12-29: qty 10

## 2017-12-29 MED ORDER — DEXAMETHASONE SODIUM PHOSPHATE 10 MG/ML IJ SOLN
INTRAMUSCULAR | Status: DC | PRN
  Administered 2017-12-29: 10 mg via INTRAVENOUS

## 2017-12-29 MED ORDER — ROCURONIUM BROMIDE 50 MG/5ML IV SOLN
INTRAVENOUS | Status: AC
  Filled 2017-12-29: qty 2

## 2017-12-29 MED ORDER — EPHEDRINE SULFATE 50 MG/ML IJ SOLN
INTRAMUSCULAR | Status: DC | PRN
  Administered 2017-12-29 (×3): 10 mg via INTRAVENOUS

## 2017-12-29 MED ORDER — SODIUM CHLORIDE 0.9 % IR SOLN
Status: DC | PRN
  Administered 2017-12-29: 100 mL

## 2017-12-29 MED ORDER — DEXAMETHASONE SODIUM PHOSPHATE 10 MG/ML IJ SOLN
INTRAMUSCULAR | Status: AC
  Filled 2017-12-29: qty 1

## 2017-12-29 MED ORDER — FENTANYL CITRATE (PF) 100 MCG/2ML IJ SOLN
25.0000 ug | INTRAMUSCULAR | Status: DC | PRN
  Administered 2017-12-29: 25 ug via INTRAVENOUS

## 2017-12-29 MED ORDER — ONDANSETRON HCL 4 MG/2ML IJ SOLN
INTRAMUSCULAR | Status: AC
  Filled 2017-12-29: qty 2

## 2017-12-29 MED ORDER — TRAMADOL HCL 50 MG PO TABS: 50.0000 mg | ORAL_TABLET | Freq: Four times a day (QID) | ORAL | 0 refills | Status: AC | PRN

## 2017-12-29 MED ORDER — ACETAMINOPHEN 10 MG/ML IV SOLN
INTRAVENOUS | Status: AC
  Filled 2017-12-29: qty 100

## 2017-12-29 MED ORDER — BUPIVACAINE-EPINEPHRINE (PF) 0.25% -1:200000 IJ SOLN
INTRAMUSCULAR | Status: AC
  Filled 2017-12-29: qty 20

## 2017-12-29 MED ORDER — FENTANYL CITRATE (PF) 100 MCG/2ML IJ SOLN
INTRAMUSCULAR | Status: AC
  Filled 2017-12-29: qty 2

## 2017-12-29 MED ORDER — FENTANYL CITRATE (PF) 100 MCG/2ML IJ SOLN
INTRAMUSCULAR | Status: DC | PRN
  Administered 2017-12-29: 50 ug via INTRAVENOUS
  Administered 2017-12-29 (×2): 25 ug via INTRAVENOUS

## 2017-12-29 MED ORDER — LACTATED RINGERS IV SOLN
INTRAVENOUS | Status: DC
  Administered 2017-12-29: 10:00:00 via INTRAVENOUS

## 2017-12-29 MED ORDER — MIDAZOLAM HCL 2 MG/2ML IJ SOLN
INTRAMUSCULAR | Status: DC | PRN
  Administered 2017-12-29: 2 mg via INTRAVENOUS

## 2017-12-29 MED ORDER — TRAMADOL HCL 50 MG PO TABS
50.0000 mg | ORAL_TABLET | Freq: Once | ORAL | Status: AC
  Administered 2017-12-29: 50 mg via ORAL

## 2017-12-29 MED ORDER — SUGAMMADEX SODIUM 500 MG/5ML IV SOLN
INTRAVENOUS | Status: AC
  Filled 2017-12-29: qty 5

## 2017-12-29 MED ORDER — MIDAZOLAM HCL 2 MG/2ML IJ SOLN
INTRAMUSCULAR | Status: AC
  Filled 2017-12-29: qty 2

## 2017-12-29 MED ORDER — BUPIVACAINE-EPINEPHRINE 0.25% -1:200000 IJ SOLN
INTRAMUSCULAR | Status: DC | PRN
  Administered 2017-12-29: 20 mL

## 2017-12-29 MED ORDER — SEVOFLURANE IN SOLN
RESPIRATORY_TRACT | Status: AC
  Filled 2017-12-29: qty 250

## 2017-12-29 MED ORDER — ROCURONIUM BROMIDE 100 MG/10ML IV SOLN
INTRAVENOUS | Status: DC | PRN
  Administered 2017-12-29 (×2): 10 mg via INTRAVENOUS
  Administered 2017-12-29: 40 mg via INTRAVENOUS

## 2017-12-29 MED ORDER — ONDANSETRON HCL 4 MG/2ML IJ SOLN: 4.0000 mg | Freq: Once | INTRAMUSCULAR | Status: DC | PRN

## 2017-12-29 MED ORDER — ONDANSETRON HCL 4 MG/2ML IJ SOLN
INTRAMUSCULAR | Status: DC | PRN
  Administered 2017-12-29: 4 mg via INTRAVENOUS

## 2017-12-29 MED ORDER — SUGAMMADEX SODIUM 200 MG/2ML IV SOLN
INTRAVENOUS | Status: DC | PRN
  Administered 2017-12-29: 300 mg via INTRAVENOUS

## 2017-12-29 MED ORDER — LACTATED RINGERS IV SOLN
INTRAVENOUS | Status: DC | PRN
  Administered 2017-12-29 (×2): via INTRAVENOUS

## 2017-12-29 MED ORDER — CEFAZOLIN SODIUM-DEXTROSE 2-4 GM/100ML-% IV SOLN
INTRAVENOUS | Status: AC
  Filled 2017-12-29: qty 100

## 2017-12-29 MED ORDER — PROPOFOL 10 MG/ML IV BOLUS
INTRAVENOUS | Status: DC | PRN
  Administered 2017-12-29: 100 mg via INTRAVENOUS

## 2017-12-29 SURGICAL SUPPLY — 31 items
ADH SKN CLS APL DERMABOND .7 (GAUZE/BANDAGES/DRESSINGS) ×1
BLADE SURG 15 STRL LF DISP TIS (BLADE) ×1 IMPLANT
BLADE SURG 15 STRL SS (BLADE) ×3
CANISTER SUCT 1200ML W/VALVE (MISCELLANEOUS) ×3 IMPLANT
CHLORAPREP W/TINT 26ML (MISCELLANEOUS) ×3 IMPLANT
DERMABOND ADVANCED (GAUZE/BANDAGES/DRESSINGS) ×2
DERMABOND ADVANCED .7 DNX12 (GAUZE/BANDAGES/DRESSINGS) ×1 IMPLANT
DRAIN PENROSE 1/4X12 LTX (DRAIN) ×3 IMPLANT
DRAPE LAPAROTOMY 100X77 ABD (DRAPES) ×3 IMPLANT
ELECT REM PT RETURN 9FT ADLT (ELECTROSURGICAL) ×3
ELECTRODE REM PT RTRN 9FT ADLT (ELECTROSURGICAL) ×1 IMPLANT
GLOVE BIO SURGEON STRL SZ 6.5 (GLOVE) ×4 IMPLANT
GLOVE BIO SURGEONS STRL SZ 6.5 (GLOVE) ×2
GLOVE BIOGEL PI IND STRL 6.5 (GLOVE) ×1 IMPLANT
GLOVE BIOGEL PI INDICATOR 6.5 (GLOVE) ×2
GOWN STRL REUS W/ TWL LRG LVL3 (GOWN DISPOSABLE) ×2 IMPLANT
GOWN STRL REUS W/TWL LRG LVL3 (GOWN DISPOSABLE) ×9
LABEL OR SOLS (LABEL) ×1 IMPLANT
MESH SYNTHETIC 1.8X4 KEYHOLE S (Mesh General) IMPLANT
MESH SYNTHETIC KEYHOLE S (Mesh General) ×2 IMPLANT
NEEDLE HYPO 22GX1.5 SAFETY (NEEDLE) ×3 IMPLANT
NS IRRIG 500ML POUR BTL (IV SOLUTION) ×3 IMPLANT
PACK BASIN MINOR ARMC (MISCELLANEOUS) ×3 IMPLANT
SUT MNCRL 4-0 (SUTURE) ×3
SUT MNCRL 4-0 27XMFL (SUTURE) ×1
SUT SURGILON 0 BLK (SUTURE) ×6 IMPLANT
SUT VIC AB 2-0 CT2 27 (SUTURE) ×3 IMPLANT
SUT VIC AB 3-0 SH 27 (SUTURE) ×6
SUT VIC AB 3-0 SH 27X BRD (SUTURE) ×2 IMPLANT
SUTURE MNCRL 4-0 27XMF (SUTURE) ×1 IMPLANT
SYR 10ML LL (SYRINGE) ×3 IMPLANT

## 2017-12-29 NOTE — Interval H&P Note (Signed)
History and Physical Interval Note:  12/29/2017 9:42 AM  Jose Kline  has presented today for surgery, with the diagnosis of unilateral inguinal hernia  The various methods of treatment have been discussed with the patient and family. After consideration of risks, benefits and other options for treatment, the patient has consented to  Procedure(s): HERNIA REPAIR INGUINAL ADULT (Right) INSERTION OF MESH (Right) as a surgical intervention .  The patient's history has been reviewed, patient examined, no change in status, stable for surgery.  I have reviewed the patient's chart and labs.  Correct site was marked in the pre procedure room with patient awake. Questions were answered to the patient's satisfaction.     Herbert Pun

## 2017-12-29 NOTE — Op Note (Signed)
Preoperative diagnosis: Right Inguinal Hernia.  Postoperative diagnosis: Right Indirect Inguinal Hernia.  Procedure: Right Inguinal hernia repair with mesh                      Excision of lipoma from spermatic cord  Anesthesia: General  Surgeon: Dr. Windell Moment  Wound Classification: Clean  Indications:  Patient is a 67 y.o. male developed a symptomatic right inguinal hernia. Repair was indicated to avoid complications of incarceration, obstruction and pain, and a prosthetic mesh repair was elected.  Findings: 1. Vas Deferens and cord structures identified and preserved 2. An indirect inguinal hernia was identified 3. Bard soft mesh pre shaped used for repair 4. Adequate hemostasis achieved  Description of procedure: The patient was taken to the operating room. A time-out was completed verifying correct patient, procedure, site, positioning, and implant(s) and/or special equipment prior to beginning this procedure. General anesthesia was induced. The right groin was prepped and draped in the usual sterile fashion. An incision was marked in a natural skin crease and planned to end near the pubic tubercle.  The skin crease incision was made with a knife and deepened through Scarpa's and Camper's fascia with electrocautery until the aponeurosis of the external oblique was encountered. This was cleaned and the external ring was exposed. Hemostasis was achieved in the wound. An incision was made in the midportion of the external oblique aponeurosis in the direction of its fibers. The ilioinguinal nerve was identified and protected throughout the dissection. Flaps of the external oblique were developed cephalad and inferiorly.  The cord was identified. It was gently dissected free at the pubic tubercle and encircled with a Penrose drain. Attention was directed to the anteromedial aspect of the cord, where an indirect hernia sac was identified. The sac was carefully dissected free of the cord down  to the level of the internal ring. The vas and testicular vessels were identified and protected from harm. The sac was reduced without tension. A lipoma was dissected from spermatic cord and removed and sent to pathology. Attention then turned to the floor of the canal, which appeared to be grossly weakened without a well-defined defect or sac. The Pre shaped Bard soft mesh was inserted. Beginning at the pubic tubercle, the mesh was sutured to the inguinal ligament inferiorly and the conjoint tendon superiorly using interrupted 0 nonabsorbable sutures. Care was taken to assure that the mesh was placed in a relaxed fashion to avoid excessive tension and that no neurovascular structures were caught in the repair. Laterally, the tails of the mesh were crossed and the internal ring recreated.  Hemostasis was again checked. The Penrose drain was removed. The external oblique aponeurosis was closed with a running suture of 3-0 Vicryl, taking care not to catch the ilioinguinal nerve in the suture line. Scarpa's fascia was closed with interrupted 3-0 Vicryl.  The skin was closed with a subcuticular stitch of Monocryl 4-0. Dermabond was applied.  The testis was gently pulled down into its anatomic position in the scrotum.  The patient tolerated the procedure well and was taken to the postanesthesia care unit in stable condition.   Specimen: Cord lipoma  Complications: None  Estimated Blood Loss: 3mL

## 2017-12-29 NOTE — Anesthesia Postprocedure Evaluation (Signed)
Anesthesia Post Note  Patient: Jose Kline  Procedure(s) Performed: HERNIA REPAIR INGUINAL ADULT (Right ) INSERTION OF MESH (Right )  Patient location during evaluation: PACU Anesthesia Type: General Level of consciousness: awake and alert and oriented Pain management: pain level controlled Vital Signs Assessment: post-procedure vital signs reviewed and stable Respiratory status: spontaneous breathing Cardiovascular status: blood pressure returned to baseline Anesthetic complications: no     Last Vitals:  Vitals:   12/29/17 1345 12/29/17 1350  BP:    Pulse: 69 66  Resp: (!) 25 20  Temp:    SpO2: 93% 90%    Last Pain:  Vitals:   12/29/17 1350  PainSc: Asleep                 Tyra Gural

## 2017-12-29 NOTE — Anesthesia Preprocedure Evaluation (Signed)
Anesthesia Evaluation  Patient identified by MRN, date of birth, ID band Patient awake    Reviewed: Allergy & Precautions, NPO status , Patient's Chart, lab work & pertinent test results  Airway Mallampati: II  TM Distance: >3 FB     Dental  (+) Teeth Intact   Pulmonary Current Smoker,    Pulmonary exam normal        Cardiovascular hypertension, Normal cardiovascular exam     Neuro/Psych  Headaches, Anxiety    GI/Hepatic Neg liver ROS, GERD  Medicated,  Endo/Other  negative endocrine ROS  Renal/GU negative Renal ROS  negative genitourinary   Musculoskeletal  (+) Arthritis ,   Abdominal Normal abdominal exam  (+)   Peds negative pediatric ROS (+)  Hematology negative hematology ROS (+)   Anesthesia Other Findings   Reproductive/Obstetrics                             Anesthesia Physical Anesthesia Plan  ASA: II  Anesthesia Plan: General   Post-op Pain Management:    Induction: Intravenous  PONV Risk Score and Plan:   Airway Management Planned: Oral ETT  Additional Equipment:   Intra-op Plan:   Post-operative Plan: Extubation in OR  Informed Consent: I have reviewed the patients History and Physical, chart, labs and discussed the procedure including the risks, benefits and alternatives for the proposed anesthesia with the patient or authorized representative who has indicated his/her understanding and acceptance.   Dental advisory given  Plan Discussed with: Surgeon and CRNA  Anesthesia Plan Comments:         Anesthesia Quick Evaluation

## 2017-12-29 NOTE — Anesthesia Procedure Notes (Signed)
Procedure Name: Intubation Date/Time: 12/29/2017 11:09 AM Performed by: Carron Curie, CRNA Pre-anesthesia Checklist: Patient identified, Emergency Drugs available, Suction available, Patient being monitored and Timeout performed Patient Re-evaluated:Patient Re-evaluated prior to induction Oxygen Delivery Method: Circle system utilized Preoxygenation: Pre-oxygenation with 100% oxygen Induction Type: IV induction Ventilation: Mask ventilation without difficulty Laryngoscope Size: Mac and 3 Grade View: Grade I Tube type: Oral Tube size: 7.5 mm Number of attempts: 1 Placement Confirmation: ETT inserted through vocal cords under direct vision,  positive ETCO2 and breath sounds checked- equal and bilateral Secured at: 22 cm Tube secured with: Tape Dental Injury: Teeth and Oropharynx as per pre-operative assessment

## 2017-12-29 NOTE — Discharge Instructions (Signed)
°  Diet: Resume home heart healthy regular diet.  ° °Activity: No heavy lifting >20 pounds (children, pets, laundry, garbage) or strenuous activity until follow-up, but light activity and walking are encouraged. Do not drive or drink alcohol if taking narcotic pain medications. ° °Wound care: May shower with soapy water and pat dry (do not rub incisions), but no baths or submerging incision underwater until follow-up. (no swimming)  ° °Medications: Resume all home medications. For mild to moderate pain: acetaminophen (Tylenol) or ibuprofen (if no kidney disease). Combining Tylenol with alcohol can substantially increase your risk of causing liver disease. Narcotic pain medications, if prescribed, can be used for severe pain, though may cause nausea, constipation, and drowsiness. Do not combine Tylenol and Percocet within a 6 hour period as Percocet contains Tylenol. If you do not need the narcotic pain medication, you do not need to fill the prescription. ° °Call office ([phone]) at any time if any questions, worsening pain, fevers/chills, bleeding, drainage from incision site, or other concerns. ° °AMBULATORY SURGERY  °DISCHARGE INSTRUCTIONS ° ° °1) The drugs that you were given will stay in your system until tomorrow so for the next 24 hours you should not: ° °A) Drive an automobile °B) Make any legal decisions °C) Drink any alcoholic beverage ° ° °2) You may resume regular meals tomorrow.  Today it is better to start with liquids and gradually work up to solid foods. ° °You may eat anything you prefer, but it is better to start with liquids, then soup and crackers, and gradually work up to solid foods. ° ° °3) Please notify your doctor immediately if you have any unusual bleeding, trouble breathing, redness and pain at the surgery site, drainage, fever, or pain not relieved by medication. ° ° ° °4) Additional Instructions: ° ° ° ° °Please contact your physician with any problems or Same Day Surgery at  [phone], Monday through Friday 6 am to 4 pm, or [facility] number at [phone]. ° °

## 2017-12-29 NOTE — Transfer of Care (Signed)
Immediate Anesthesia Transfer of Care Note  Patient: Jose Kline  Procedure(s) Performed: HERNIA REPAIR INGUINAL ADULT (Right ) INSERTION OF MESH (Right )  Patient Location: PACU  Anesthesia Type:General  Level of Consciousness: awake  Airway & Oxygen Therapy: Patient Spontanous Breathing  Post-op Assessment: Report given to RN  Post vital signs: stable  Last Vitals:  Vitals:   12/29/17 0917  BP: 132/83  Pulse: 65  Resp: 18  Temp: 36.5 C  SpO2: 99%    Last Pain: There were no vitals filed for this visit.       Complications: No apparent anesthesia complications

## 2017-12-29 NOTE — Anesthesia Post-op Follow-up Note (Signed)
Anesthesia QCDR form completed.        

## 2017-12-29 NOTE — OR Nursing (Signed)
Discharge instructions discussed with pt and daughter. Both voice understanding. 

## 2017-12-30 ENCOUNTER — Encounter: Payer: Self-pay | Admitting: General Surgery

## 2017-12-30 LAB — SURGICAL PATHOLOGY

## 2018-01-02 NOTE — Addendum Note (Signed)
Addendum  created 01/02/18 1058 by Doreen Salvage, CRNA   Charge Capture section accepted

## 2018-11-22 ENCOUNTER — Ambulatory Visit
Admission: RE | Admit: 2018-11-22 | Discharge: 2018-11-22 | Disposition: A | Discharge status: Home / Self Care | Payer: BC Managed Care – PPO | Source: Ambulatory Visit | Attending: Internal Medicine | Admitting: Internal Medicine

## 2018-11-22 ENCOUNTER — Other Ambulatory Visit: Payer: Self-pay | Admitting: Internal Medicine

## 2018-11-22 DIAGNOSIS — R059 Cough, unspecified: Secondary | ICD-10-CM

## 2018-11-22 DIAGNOSIS — R05 Cough: Secondary | ICD-10-CM

## 2019-03-06 ENCOUNTER — Other Ambulatory Visit: Payer: Self-pay | Admitting: Neurosurgery

## 2019-03-06 DIAGNOSIS — M4726 Other spondylosis with radiculopathy, lumbar region: Secondary | ICD-10-CM

## 2019-03-09 ENCOUNTER — Ambulatory Visit
Admission: RE | Admit: 2019-03-09 | Discharge: 2019-03-09 | Disposition: A | Discharge status: Home / Self Care | Payer: BC Managed Care – PPO | Source: Ambulatory Visit | Attending: Neurosurgery | Admitting: Neurosurgery

## 2019-03-09 ENCOUNTER — Other Ambulatory Visit: Payer: Self-pay

## 2019-03-09 DIAGNOSIS — M4726 Other spondylosis with radiculopathy, lumbar region: Secondary | ICD-10-CM

## 2019-03-09 MED ORDER — GADOBENATE DIMEGLUMINE 529 MG/ML IV SOLN
15.0000 mL | Freq: Once | INTRAVENOUS | Status: AC | PRN
  Administered 2019-03-09: 15 mL via INTRAVENOUS

## 2019-03-13 ENCOUNTER — Other Ambulatory Visit: Payer: Self-pay | Admitting: Neurosurgery

## 2019-03-26 ENCOUNTER — Other Ambulatory Visit: Payer: Self-pay | Admitting: Neurosurgery

## 2019-04-03 ENCOUNTER — Other Ambulatory Visit (HOSPITAL_COMMUNITY)
Admission: RE | Admit: 2019-04-03 | Discharge: 2019-04-03 | Disposition: A | Discharge status: Home / Self Care | Payer: Medicare Other | Source: Ambulatory Visit | Attending: Neurosurgery | Admitting: Neurosurgery

## 2019-04-03 ENCOUNTER — Encounter (HOSPITAL_COMMUNITY): Payer: Self-pay

## 2019-04-03 ENCOUNTER — Other Ambulatory Visit (HOSPITAL_COMMUNITY): Payer: BC Managed Care – PPO

## 2019-04-03 ENCOUNTER — Other Ambulatory Visit: Payer: Self-pay

## 2019-04-03 ENCOUNTER — Encounter (HOSPITAL_COMMUNITY)
Admission: RE | Admit: 2019-04-03 | Discharge: 2019-04-03 | Disposition: A | Discharge status: Home / Self Care | Payer: Medicare Other | Source: Ambulatory Visit | Attending: Neurosurgery | Admitting: Neurosurgery

## 2019-04-03 DIAGNOSIS — Z01818 Encounter for other preprocedural examination: Secondary | ICD-10-CM | POA: Insufficient documentation

## 2019-04-03 DIAGNOSIS — M48062 Spinal stenosis, lumbar region with neurogenic claudication: Secondary | ICD-10-CM | POA: Insufficient documentation

## 2019-04-03 DIAGNOSIS — I10 Essential (primary) hypertension: Secondary | ICD-10-CM | POA: Diagnosis not present

## 2019-04-03 DIAGNOSIS — Z1159 Encounter for screening for other viral diseases: Secondary | ICD-10-CM | POA: Insufficient documentation

## 2019-04-03 DIAGNOSIS — R001 Bradycardia, unspecified: Secondary | ICD-10-CM | POA: Insufficient documentation

## 2019-04-03 LAB — CBC
HCT: 41.2 % (ref 39.0–52.0)
Hemoglobin: 13.9 g/dL (ref 13.0–17.0)
MCH: 31 pg (ref 26.0–34.0)
MCHC: 33.7 g/dL (ref 30.0–36.0)
MCV: 91.8 fL (ref 80.0–100.0)
Platelets: 251 10*3/uL (ref 150–400)
RBC: 4.49 MIL/uL (ref 4.22–5.81)
RDW: 13.7 % (ref 11.5–15.5)
WBC: 6.4 10*3/uL (ref 4.0–10.5)
nRBC: 0 % (ref 0.0–0.2)

## 2019-04-03 LAB — SURGICAL PCR SCREEN
MRSA, PCR: NEGATIVE
Staphylococcus aureus: NEGATIVE

## 2019-04-03 LAB — BASIC METABOLIC PANEL
Anion gap: 9 (ref 5–15)
BUN: 18 mg/dL (ref 8–23)
CO2: 25 mmol/L (ref 22–32)
Calcium: 9.1 mg/dL (ref 8.9–10.3)
Chloride: 105 mmol/L (ref 98–111)
Creatinine, Ser: 0.98 mg/dL (ref 0.61–1.24)
GFR calc Af Amer: >60 mL/min (ref >60)
GFR calc non Af Amer: >60 mL/min (ref >60)
Glucose, Bld: 102 mg/dL — ABNORMAL HIGH (ref 70–99)
Potassium: 3.5 mmol/L (ref 3.5–5.1)
Sodium: 139 mmol/L (ref 135–145)

## 2019-04-03 LAB — TYPE AND SCREEN
ABO/RH(D): A POS
Antibody Screen: NEGATIVE

## 2019-04-03 NOTE — Pre-Procedure Instructions (Addendum)
Jose Kline  04/03/2019    Your procedure is scheduled on Thursday, April 05, 2019 at 7:30 AM.   Report to [facility] Entrance "A" Admitting Office at 5:30 AM.   Call this number if you have problems the morning of surgery: [phone]   Questions prior to day of surgery, please call [phone] between 8 & 4 PM.   Remember:  Do not eat or drink after midnight Wednesday, 04/04/19.  Take these medicines the morning of surgery with A SIP OF WATER: Lovastatin (Mevacor), Omeprazole (Prilosec) - if needed  Do not smoke 24 hours prior to surgery.    Do not wear jewelry.  Do not wear lotions, powders, cologne or deodorant.  Men may shave face and neck.  Do not bring valuables to the hospital.  [facility] is not responsible for any belongings or valuables.  Contacts, dentures or bridgework may not be worn into surgery.  Leave your suitcase in the car.  After surgery it may be brought to your room.  For patients admitted to the hospital, discharge time will be determined by your treatment team.  [facility] - Preparing for Surgery  Before surgery, you can play an important role.  Because skin is not sterile, your skin needs to be as free of germs as possible.  You can reduce the number of germs on you skin by washing with CHG (chlorahexidine gluconate) soap before surgery.  CHG is an antiseptic cleaner which kills germs and bonds with the skin to continue killing germs even after washing.  Oral Hygiene is also important in reducing the risk of infection.  Remember to brush your teeth with your regular toothpaste the morning of surgery.  Please DO NOT use if you have an allergy to CHG or antibacterial soaps.  If your skin becomes reddened/irritated stop using the CHG and inform your nurse when you arrive at Short Stay.  Do not shave (including legs and underarms) for at least 48 hours prior to the first CHG shower.  You may shave your face.  Please follow these instructions  carefully:   1.  Shower with CHG Soap the night before surgery and the morning of Surgery.  2.  If you choose to wash your hair, wash your hair first as usual with your normal shampoo.  3.  After you shampoo, rinse your hair and body thoroughly to remove the shampoo. 4.  Use CHG as you would any other liquid soap.  You can apply chg directly to the skin and wash gently with a      scrungie or washcloth.           5.  Apply the CHG Soap to your body ONLY FROM THE NECK DOWN.   Do not use on open wounds or open sores. Avoid contact with your eyes, ears, mouth and genitals (private parts).  Wash genitals (private parts) with your normal soap.  6.  Wash thoroughly, paying special attention to the area where your surgery will be performed.  7.  Thoroughly rinse your body with warm water from the neck down.  8.  DO NOT shower/wash with your normal soap after using and rinsing off the CHG Soap.  9.  Pat yourself dry with a clean towel.            10.  Wear clean pajamas.            11.  Place clean sheets on your bed the night of your first  shower and do not sleep with pets.  Day of Surgery  Shower as above.  Do not apply any lotions/deodorants the morning of surgery.   Please wear clean clothes to the hospital. Remember to brush your teeth with toothpaste.   Please read over the fact sheets that you were given.

## 2019-04-03 NOTE — Progress Notes (Signed)
PCP - Dr. Janeice Robinson and Dr. Harrel Lemon Cardiologist - n/a  Chest x-ray - 11/22/18 EKG - 04/03/19 Stress Test - n/a ECHO - n/a Cardiac Cath - n/a  Sleep Study - n/a CPAP -   Fasting Blood Sugar - n/a Checks Blood Sugar _____ times a day  Blood Thinner Instructions:n/a Aspirin Instructions: n/a  Anesthesia review: no Patient denies shortness of breath, fever, cough and chest pain at PAT appointment   Patient verbalized understanding of instructions that were given to them at the PAT appointment. Patient was also instructed that they will need to review over the PAT instructions again at home before surgery.   Pt has Covid Test appt this afternoon. Pt understands to self quarantine after having test done.   Coronavirus Screening  Have you experienced the following symptoms:  Cough NO Fever (>100.55F)  NO Runny nose NO Sore throat NO Difficulty breathing/shortness of breath  NO  Have you or a family member traveled in the last 14 days and where? NO    Patient reminded that hospital visitation restrictions are in effect and the importance of the restrictions.

## 2019-04-04 LAB — NOVEL CORONAVIRUS, NAA (HOSP ORDER, SEND-OUT TO REF LAB; TAT 18-24 HRS): SARS-CoV-2, NAA: NOT DETECTED

## 2019-04-05 ENCOUNTER — Inpatient Hospital Stay (HOSPITAL_COMMUNITY): Payer: Medicare Other

## 2019-04-05 ENCOUNTER — Inpatient Hospital Stay (HOSPITAL_COMMUNITY): Payer: Medicare Other | Admitting: Critical Care Medicine

## 2019-04-05 ENCOUNTER — Encounter (HOSPITAL_COMMUNITY): Payer: Self-pay | Admitting: Critical Care Medicine

## 2019-04-05 ENCOUNTER — Encounter (HOSPITAL_COMMUNITY)
Admission: RE | Disposition: A | Discharge status: Home / Self Care | Payer: Self-pay | Source: Home / Self Care | Attending: Neurosurgery

## 2019-04-05 ENCOUNTER — Inpatient Hospital Stay (HOSPITAL_COMMUNITY)
Admission: RE | Admit: 2019-04-05 | Discharge: 2019-04-06 | DRG: 455 | Disposition: A | Discharge status: Home / Self Care | Payer: Medicare Other | Attending: Neurosurgery | Admitting: Neurosurgery

## 2019-04-05 ENCOUNTER — Other Ambulatory Visit: Payer: Self-pay

## 2019-04-05 ENCOUNTER — Inpatient Hospital Stay (HOSPITAL_COMMUNITY): Payer: Medicare Other | Admitting: Vascular Surgery

## 2019-04-05 DIAGNOSIS — I10 Essential (primary) hypertension: Secondary | ICD-10-CM | POA: Diagnosis present

## 2019-04-05 DIAGNOSIS — Z841 Family history of disorders of kidney and ureter: Secondary | ICD-10-CM | POA: Diagnosis not present

## 2019-04-05 DIAGNOSIS — F17210 Nicotine dependence, cigarettes, uncomplicated: Secondary | ICD-10-CM | POA: Diagnosis present

## 2019-04-05 DIAGNOSIS — Z419 Encounter for procedure for purposes other than remedying health state, unspecified: Secondary | ICD-10-CM

## 2019-04-05 DIAGNOSIS — M48062 Spinal stenosis, lumbar region with neurogenic claudication: Secondary | ICD-10-CM | POA: Diagnosis present

## 2019-04-05 DIAGNOSIS — M5116 Intervertebral disc disorders with radiculopathy, lumbar region: Secondary | ICD-10-CM | POA: Diagnosis present

## 2019-04-05 DIAGNOSIS — E785 Hyperlipidemia, unspecified: Secondary | ICD-10-CM | POA: Diagnosis present

## 2019-04-05 DIAGNOSIS — M4726 Other spondylosis with radiculopathy, lumbar region: Secondary | ICD-10-CM | POA: Diagnosis present

## 2019-04-05 DIAGNOSIS — K219 Gastro-esophageal reflux disease without esophagitis: Secondary | ICD-10-CM | POA: Diagnosis present

## 2019-04-05 SURGERY — POSTERIOR LUMBAR FUSION 2 LEVEL
Anesthesia: General | Site: Back

## 2019-04-05 MED ORDER — THROMBIN 20000 UNITS EX SOLR
CUTANEOUS | Status: AC
  Filled 2019-04-05: qty 20000

## 2019-04-05 MED ORDER — MIDAZOLAM HCL 5 MG/5ML IJ SOLN
INTRAMUSCULAR | Status: DC | PRN
  Administered 2019-04-05: 2 mg via INTRAVENOUS

## 2019-04-05 MED ORDER — PHENYLEPHRINE 40 MCG/ML (10ML) SYRINGE FOR IV PUSH (FOR BLOOD PRESSURE SUPPORT)
PREFILLED_SYRINGE | INTRAVENOUS | Status: AC
  Filled 2019-04-05: qty 10

## 2019-04-05 MED ORDER — SODIUM CHLORIDE 0.9 % IV SOLN
INTRAVENOUS | Status: DC | PRN
  Administered 2019-04-05: 12:00:00 via INTRAVENOUS

## 2019-04-05 MED ORDER — HYDROCHLOROTHIAZIDE 12.5 MG PO CAPS
12.5000 mg | ORAL_CAPSULE | Freq: Every day | ORAL | Status: DC
  Administered 2019-04-06: 12.5 mg via ORAL
  Filled 2019-04-05: qty 1

## 2019-04-05 MED ORDER — MORPHINE SULFATE (PF) 4 MG/ML IV SOLN: 4.0000 mg | INTRAVENOUS | Status: DC | PRN

## 2019-04-05 MED ORDER — THROMBIN 20000 UNITS EX SOLR
CUTANEOUS | Status: DC | PRN
  Administered 2019-04-05: 10 mL via TOPICAL

## 2019-04-05 MED ORDER — SODIUM CHLORIDE 0.9% FLUSH: 3.0000 mL | INTRAVENOUS | Status: DC | PRN

## 2019-04-05 MED ORDER — CYCLOBENZAPRINE HCL 5 MG PO TABS
5.0000 mg | ORAL_TABLET | Freq: Three times a day (TID) | ORAL | Status: DC | PRN
  Administered 2019-04-05 (×2): 10 mg via ORAL
  Filled 2019-04-05: qty 2

## 2019-04-05 MED ORDER — BUPIVACAINE HCL (PF) 0.5 % IJ SOLN
INTRAMUSCULAR | Status: AC
  Filled 2019-04-05: qty 30

## 2019-04-05 MED ORDER — ONDANSETRON HCL 4 MG/2ML IJ SOLN
INTRAMUSCULAR | Status: DC | PRN
  Administered 2019-04-05: 4 mg via INTRAVENOUS

## 2019-04-05 MED ORDER — PROMETHAZINE HCL 25 MG/ML IJ SOLN: 6.2500 mg | INTRAMUSCULAR | Status: DC | PRN

## 2019-04-05 MED ORDER — BISACODYL 10 MG RE SUPP: 10.0000 mg | Freq: Every day | RECTAL | Status: DC | PRN

## 2019-04-05 MED ORDER — ACETAMINOPHEN 325 MG PO TABS: 325.0000 mg | ORAL_TABLET | Freq: Once | ORAL | Status: DC | PRN

## 2019-04-05 MED ORDER — ACETAMINOPHEN 325 MG PO TABS: 650.0000 mg | ORAL_TABLET | ORAL | Status: DC | PRN

## 2019-04-05 MED ORDER — SODIUM CHLORIDE 0.9 % IV SOLN
INTRAVENOUS | Status: DC | PRN
  Administered 2019-04-05: 15 ug/min via INTRAVENOUS

## 2019-04-05 MED ORDER — PHENYLEPHRINE 40 MCG/ML (10ML) SYRINGE FOR IV PUSH (FOR BLOOD PRESSURE SUPPORT)
PREFILLED_SYRINGE | INTRAVENOUS | Status: DC | PRN
  Administered 2019-04-05 (×2): 40 ug via INTRAVENOUS

## 2019-04-05 MED ORDER — ACETAMINOPHEN 650 MG RE SUPP: 650.0000 mg | RECTAL | Status: DC | PRN

## 2019-04-05 MED ORDER — KETOROLAC TROMETHAMINE 30 MG/ML IJ SOLN
15.0000 mg | Freq: Once | INTRAMUSCULAR | Status: AC
  Administered 2019-04-05: 15 mg via INTRAVENOUS

## 2019-04-05 MED ORDER — DEXAMETHASONE SODIUM PHOSPHATE 10 MG/ML IJ SOLN
INTRAMUSCULAR | Status: DC | PRN
  Administered 2019-04-05: 10 mg via INTRAVENOUS

## 2019-04-05 MED ORDER — CHLORHEXIDINE GLUCONATE CLOTH 2 % EX PADS: 6.0000 | MEDICATED_PAD | Freq: Once | CUTANEOUS | Status: DC

## 2019-04-05 MED ORDER — ALUM & MAG HYDROXIDE-SIMETH 200-200-20 MG/5ML PO SUSP: 30.0000 mL | Freq: Four times a day (QID) | ORAL | Status: DC | PRN

## 2019-04-05 MED ORDER — MENTHOL 3 MG MT LOZG: 1.0000 | LOZENGE | OROMUCOSAL | Status: DC | PRN

## 2019-04-05 MED ORDER — SODIUM CHLORIDE (PF) 0.9 % IJ SOLN
INTRAMUSCULAR | Status: AC
  Filled 2019-04-05: qty 10

## 2019-04-05 MED ORDER — OXYCODONE HCL 5 MG PO TABS
5.0000 mg | ORAL_TABLET | Freq: Once | ORAL | Status: AC | PRN
  Administered 2019-04-05: 5 mg via ORAL

## 2019-04-05 MED ORDER — KCL IN DEXTROSE-NACL 40-5-0.45 MEQ/L-%-% IV SOLN
INTRAVENOUS | Status: DC
  Filled 2019-04-05: qty 1000

## 2019-04-05 MED ORDER — ROCURONIUM BROMIDE 10 MG/ML (PF) SYRINGE
PREFILLED_SYRINGE | INTRAVENOUS | Status: DC | PRN
  Administered 2019-04-05 (×2): 20 mg via INTRAVENOUS
  Administered 2019-04-05: 30 mg via INTRAVENOUS
  Administered 2019-04-05: 50 mg via INTRAVENOUS
  Administered 2019-04-05 (×2): 20 mg via INTRAVENOUS

## 2019-04-05 MED ORDER — SUGAMMADEX SODIUM 200 MG/2ML IV SOLN
INTRAVENOUS | Status: DC | PRN
  Administered 2019-04-05: 150 mg via INTRAVENOUS

## 2019-04-05 MED ORDER — ACETAMINOPHEN 160 MG/5ML PO SOLN: 325.0000 mg | Freq: Once | ORAL | Status: DC | PRN

## 2019-04-05 MED ORDER — ALBUMIN HUMAN 5 % IV SOLN
INTRAVENOUS | Status: DC | PRN
  Administered 2019-04-05: 10:00:00 via INTRAVENOUS

## 2019-04-05 MED ORDER — OXYCODONE HCL 5 MG PO TABS
ORAL_TABLET | ORAL | Status: AC
  Filled 2019-04-05: qty 1

## 2019-04-05 MED ORDER — LIDOCAINE-EPINEPHRINE 1 %-1:100000 IJ SOLN
INTRAMUSCULAR | Status: DC | PRN
  Administered 2019-04-05: 15 mL

## 2019-04-05 MED ORDER — ARTIFICIAL TEARS OPHTHALMIC OINT
TOPICAL_OINTMENT | OPHTHALMIC | Status: AC
  Filled 2019-04-05: qty 3.5

## 2019-04-05 MED ORDER — ROCURONIUM BROMIDE 10 MG/ML (PF) SYRINGE
PREFILLED_SYRINGE | INTRAVENOUS | Status: AC
  Filled 2019-04-05: qty 10

## 2019-04-05 MED ORDER — ACETAMINOPHEN 10 MG/ML IV SOLN
INTRAVENOUS | Status: AC
  Filled 2019-04-05: qty 100

## 2019-04-05 MED ORDER — FENTANYL CITRATE (PF) 250 MCG/5ML IJ SOLN
INTRAMUSCULAR | Status: AC
  Filled 2019-04-05: qty 5

## 2019-04-05 MED ORDER — ONDANSETRON HCL 4 MG/2ML IJ SOLN
INTRAMUSCULAR | Status: AC
  Filled 2019-04-05: qty 2

## 2019-04-05 MED ORDER — SODIUM CHLORIDE 0.9 % IV SOLN: 250.0000 mL | INTRAVENOUS | Status: DC

## 2019-04-05 MED ORDER — PROPOFOL 10 MG/ML IV BOLUS
INTRAVENOUS | Status: DC | PRN
  Administered 2019-04-05: 110 mg via INTRAVENOUS

## 2019-04-05 MED ORDER — SODIUM CHLORIDE 0.9 % IV SOLN
INTRAVENOUS | Status: DC | PRN
  Administered 2019-04-05 (×4): 500 mL

## 2019-04-05 MED ORDER — HYDROXYZINE HCL 25 MG PO TABS: 50.0000 mg | ORAL_TABLET | ORAL | Status: DC | PRN

## 2019-04-05 MED ORDER — 0.9 % SODIUM CHLORIDE (POUR BTL) OPTIME
TOPICAL | Status: DC | PRN
  Administered 2019-04-05 (×2): 1000 mL

## 2019-04-05 MED ORDER — THROMBIN 5000 UNITS EX SOLR
OROMUCOSAL | Status: DC | PRN
  Administered 2019-04-05: 5 mL via TOPICAL

## 2019-04-05 MED ORDER — PROPOFOL 10 MG/ML IV BOLUS
INTRAVENOUS | Status: AC
  Filled 2019-04-05: qty 20

## 2019-04-05 MED ORDER — FENTANYL CITRATE (PF) 250 MCG/5ML IJ SOLN
INTRAMUSCULAR | Status: DC | PRN
  Administered 2019-04-05 (×2): 25 ug via INTRAVENOUS
  Administered 2019-04-05 (×3): 50 ug via INTRAVENOUS
  Administered 2019-04-05: 25 ug via INTRAVENOUS
  Administered 2019-04-05: 50 ug via INTRAVENOUS
  Administered 2019-04-05: 100 ug via INTRAVENOUS
  Administered 2019-04-05 (×2): 50 ug via INTRAVENOUS
  Administered 2019-04-05: 25 ug via INTRAVENOUS

## 2019-04-05 MED ORDER — MEPERIDINE HCL 25 MG/ML IJ SOLN: 6.2500 mg | INTRAMUSCULAR | Status: DC | PRN

## 2019-04-05 MED ORDER — OXYCODONE HCL 5 MG/5ML PO SOLN: 5.0000 mg | Freq: Once | ORAL | Status: AC | PRN

## 2019-04-05 MED ORDER — PRAVASTATIN SODIUM 10 MG PO TABS
20.0000 mg | ORAL_TABLET | Freq: Every day | ORAL | Status: DC
  Administered 2019-04-05: 20 mg via ORAL
  Filled 2019-04-05: qty 2

## 2019-04-05 MED ORDER — SODIUM CHLORIDE 0.9% FLUSH: 3.0000 mL | Freq: Two times a day (BID) | INTRAVENOUS | Status: DC

## 2019-04-05 MED ORDER — HYDROMORPHONE HCL 1 MG/ML IJ SOLN
INTRAMUSCULAR | Status: AC
  Filled 2019-04-05: qty 1

## 2019-04-05 MED ORDER — CYCLOBENZAPRINE HCL 10 MG PO TABS
ORAL_TABLET | ORAL | Status: AC
  Filled 2019-04-05: qty 1

## 2019-04-05 MED ORDER — DEXAMETHASONE SODIUM PHOSPHATE 10 MG/ML IJ SOLN
INTRAMUSCULAR | Status: AC
  Filled 2019-04-05: qty 1

## 2019-04-05 MED ORDER — HYDROXYZINE HCL 50 MG/ML IM SOLN
50.0000 mg | INTRAMUSCULAR | Status: DC | PRN
  Administered 2019-04-05: 50 mg via INTRAMUSCULAR
  Filled 2019-04-05: qty 1

## 2019-04-05 MED ORDER — LIDOCAINE-EPINEPHRINE 1 %-1:100000 IJ SOLN
INTRAMUSCULAR | Status: AC
  Filled 2019-04-05: qty 1

## 2019-04-05 MED ORDER — FLEET ENEMA 7-19 GM/118ML RE ENEM: 1.0000 | ENEMA | Freq: Once | RECTAL | Status: DC | PRN

## 2019-04-05 MED ORDER — PANTOPRAZOLE SODIUM 40 MG PO TBEC
80.0000 mg | DELAYED_RELEASE_TABLET | Freq: Every day | ORAL | Status: DC
  Administered 2019-04-06: 80 mg via ORAL
  Filled 2019-04-05: qty 2

## 2019-04-05 MED ORDER — HYDROMORPHONE HCL 1 MG/ML IJ SOLN
0.2500 mg | INTRAMUSCULAR | Status: DC | PRN
  Administered 2019-04-05 (×2): 0.5 mg via INTRAVENOUS

## 2019-04-05 MED ORDER — THROMBIN 5000 UNITS EX SOLR
CUTANEOUS | Status: AC
  Filled 2019-04-05: qty 5000

## 2019-04-05 MED ORDER — BUPIVACAINE HCL (PF) 0.5 % IJ SOLN
INTRAMUSCULAR | Status: DC | PRN
  Administered 2019-04-05: 15 mL

## 2019-04-05 MED ORDER — HYDROCODONE-ACETAMINOPHEN 5-325 MG PO TABS
1.0000 | ORAL_TABLET | ORAL | Status: DC | PRN
  Administered 2019-04-05 – 2019-04-06 (×4): 2 via ORAL
  Filled 2019-04-05 (×4): qty 2

## 2019-04-05 MED ORDER — PHENOL 1.4 % MT LIQD: 1.0000 | OROMUCOSAL | Status: DC | PRN

## 2019-04-05 MED ORDER — MIDAZOLAM HCL 2 MG/2ML IJ SOLN
INTRAMUSCULAR | Status: AC
  Filled 2019-04-05: qty 2

## 2019-04-05 MED ORDER — LACTATED RINGERS IV SOLN
INTRAVENOUS | Status: DC | PRN
  Administered 2019-04-05 (×3): via INTRAVENOUS

## 2019-04-05 MED ORDER — ACETAMINOPHEN 10 MG/ML IV SOLN: 1000.0000 mg | Freq: Once | INTRAVENOUS | Status: DC | PRN

## 2019-04-05 MED ORDER — KETOROLAC TROMETHAMINE 15 MG/ML IJ SOLN
INTRAMUSCULAR | Status: AC
  Filled 2019-04-05: qty 1

## 2019-04-05 MED ORDER — LIDOCAINE 2% (20 MG/ML) 5 ML SYRINGE
INTRAMUSCULAR | Status: DC | PRN
  Administered 2019-04-05: 60 mg via INTRAVENOUS

## 2019-04-05 MED ORDER — ACETAMINOPHEN 10 MG/ML IV SOLN
INTRAVENOUS | Status: DC | PRN
  Administered 2019-04-05: 1000 mg via INTRAVENOUS

## 2019-04-05 MED ORDER — LACTATED RINGERS IV SOLN: INTRAVENOUS | Status: DC

## 2019-04-05 MED ORDER — KETOROLAC TROMETHAMINE 30 MG/ML IJ SOLN
15.0000 mg | Freq: Four times a day (QID) | INTRAMUSCULAR | Status: DC
  Administered 2019-04-05 – 2019-04-06 (×3): 15 mg via INTRAVENOUS
  Filled 2019-04-05 (×3): qty 1

## 2019-04-05 MED ORDER — CEFAZOLIN SODIUM-DEXTROSE 2-4 GM/100ML-% IV SOLN
2.0000 g | INTRAVENOUS | Status: AC
  Administered 2019-04-05 (×2): 2 g via INTRAVENOUS
  Filled 2019-04-05: qty 100

## 2019-04-05 MED ORDER — MAGNESIUM HYDROXIDE 400 MG/5ML PO SUSP: 30.0000 mL | Freq: Every day | ORAL | Status: DC | PRN

## 2019-04-05 SURGICAL SUPPLY — 82 items
ADH SKN CLS APL DERMABOND .7 (GAUZE/BANDAGES/DRESSINGS) ×3
APL SKNCLS STERI-STRIP NONHPOA (GAUZE/BANDAGES/DRESSINGS)
BAG DECANTER FOR FLEXI CONT (MISCELLANEOUS) ×5 IMPLANT
BENZOIN TINCTURE PRP APPL 2/3 (GAUZE/BANDAGES/DRESSINGS) IMPLANT
BLADE CLIPPER SURG (BLADE) ×1 IMPLANT
BUR ACRON 5.0MM COATED (BURR) ×2 IMPLANT
BUR MATCHSTICK NEURO 3.0 LAGG (BURR) ×2 IMPLANT
CAGE POST LUM 6D 9X23X9 (Cage) ×2 IMPLANT
CAGE TRITAN 8X23X6D (Cage) ×2 IMPLANT
CANISTER SUCT 3000ML PPV (MISCELLANEOUS) ×2 IMPLANT
CAP LCK SPNE (Orthopedic Implant) ×6 IMPLANT
CAP LOCK SPINE RADIUS (Orthopedic Implant) IMPLANT
CAP LOCKING (Orthopedic Implant) ×12 IMPLANT
CARTRIDGE OIL MAESTRO DRILL (MISCELLANEOUS) ×1 IMPLANT
CONT SPEC 4OZ CLIKSEAL STRL BL (MISCELLANEOUS) ×3 IMPLANT
COVER BACK TABLE 60X90IN (DRAPES) ×2 IMPLANT
COVER WAND RF STERILE (DRAPES) ×1 IMPLANT
CROSSLINK VARIABLE M-A (Orthopedic Implant) ×1 IMPLANT
DECANTER SPIKE VIAL GLASS SM (MISCELLANEOUS) ×2 IMPLANT
DERMABOND ADVANCED (GAUZE/BANDAGES/DRESSINGS) ×3
DERMABOND ADVANCED .7 DNX12 (GAUZE/BANDAGES/DRESSINGS) ×1 IMPLANT
DIFFUSER DRILL AIR PNEUMATIC (MISCELLANEOUS) ×2 IMPLANT
DRAPE C-ARM 42X72 X-RAY (DRAPES) ×4 IMPLANT
DRAPE C-ARMOR (DRAPES) IMPLANT
DRAPE HALF SHEET 40X57 (DRAPES) ×2 IMPLANT
DRAPE LAPAROTOMY 100X72X124 (DRAPES) ×2 IMPLANT
DRAPE POUCH INSTRU U-SHP 10X18 (DRAPES) ×1 IMPLANT
ELECT REM PT RETURN 9FT ADLT (ELECTROSURGICAL) ×2
ELECTRODE REM PT RTRN 9FT ADLT (ELECTROSURGICAL) ×1 IMPLANT
GAUZE 4X4 16PLY RFD (DISPOSABLE) ×1 IMPLANT
GAUZE SPONGE 4X4 12PLY STRL (GAUZE/BANDAGES/DRESSINGS) ×2 IMPLANT
GAUZE SPONGE 4X4 12PLY STRL LF (GAUZE/BANDAGES/DRESSINGS) ×1 IMPLANT
GLOVE BIOGEL PI IND STRL 8 (GLOVE) ×2 IMPLANT
GLOVE BIOGEL PI INDICATOR 8 (GLOVE) ×2
GLOVE ECLIPSE 7.5 STRL STRAW (GLOVE) ×4 IMPLANT
GOWN STRL REUS W/ TWL LRG LVL3 (GOWN DISPOSABLE) IMPLANT
GOWN STRL REUS W/ TWL XL LVL3 (GOWN DISPOSABLE) ×2 IMPLANT
GOWN STRL REUS W/TWL 2XL LVL3 (GOWN DISPOSABLE) IMPLANT
GOWN STRL REUS W/TWL LRG LVL3 (GOWN DISPOSABLE)
GOWN STRL REUS W/TWL XL LVL3 (GOWN DISPOSABLE) ×4
KIT BASIN OR (CUSTOM PROCEDURE TRAY) ×2 IMPLANT
KIT INFUSE MEDIUM (Orthopedic Implant) ×1 IMPLANT
KIT TURNOVER KIT B (KITS) ×2 IMPLANT
MILL MEDIUM DISP (BLADE) ×1 IMPLANT
NDL 18GX1X1/2 (RX/OR ONLY) (NEEDLE) ×1 IMPLANT
NDL ASP BONE MRW 8GX15 (NEEDLE) IMPLANT
NDL SPNL 18GX3.5 QUINCKE PK (NEEDLE) ×1 IMPLANT
NDL SPNL 22GX3.5 QUINCKE BK (NEEDLE) ×1 IMPLANT
NEEDLE 18GX1X1/2 (RX/OR ONLY) (NEEDLE) IMPLANT
NEEDLE ASP BONE MRW 8GX15 (NEEDLE) ×2 IMPLANT
NEEDLE SPNL 18GX3.5 QUINCKE PK (NEEDLE) ×4 IMPLANT
NEEDLE SPNL 22GX3.5 QUINCKE BK (NEEDLE) ×4 IMPLANT
NS IRRIG 1000ML POUR BTL (IV SOLUTION) ×3 IMPLANT
OIL CARTRIDGE MAESTRO DRILL (MISCELLANEOUS) ×2
PACK LAMINECTOMY NEURO (CUSTOM PROCEDURE TRAY) ×2 IMPLANT
PAD ARMBOARD 7.5X6 YLW CONV (MISCELLANEOUS) ×6 IMPLANT
PATTIES SURGICAL .5 X.5 (GAUZE/BANDAGES/DRESSINGS) IMPLANT
PATTIES SURGICAL .5 X1 (DISPOSABLE) ×1 IMPLANT
PATTIES SURGICAL 1X1 (DISPOSABLE) ×1 IMPLANT
ROD RADIUS 80MM (Rod) ×2 IMPLANT
SCREW 5.75X45MM (Screw) ×2 IMPLANT
SCREW 5.75X50MM (Screw) ×2 IMPLANT
SPONGE LAP 4X18 RFD (DISPOSABLE) IMPLANT
SPONGE NEURO XRAY DETECT 1X3 (DISPOSABLE) ×1 IMPLANT
SPONGE SURGIFOAM ABS GEL 100 (HEMOSTASIS) IMPLANT
STAPLER SKIN PROX WIDE 3.9 (STAPLE) IMPLANT
STRIP BIOACTIVE VITOSS 25X100X (Neuro Prosthesis/Implant) ×2 IMPLANT
STRIP CLOSURE SKIN 1/2X4 (GAUZE/BANDAGES/DRESSINGS) IMPLANT
SUT PROLENE 6 0 BV (SUTURE) IMPLANT
SUT VIC AB 1 CT1 18XBRD ANBCTR (SUTURE) ×2 IMPLANT
SUT VIC AB 1 CT1 8-18 (SUTURE) ×6
SUT VIC AB 2-0 CP2 18 (SUTURE) ×5 IMPLANT
SYR 3ML LL SCALE MARK (SYRINGE) ×2 IMPLANT
SYR 5ML LL (SYRINGE) ×3 IMPLANT
SYR BULB IRRIGATION 50ML (SYRINGE) ×1 IMPLANT
SYR CONTROL 10ML LL (SYRINGE) ×3 IMPLANT
TAPE CLOTH SURG 4X10 WHT LF (GAUZE/BANDAGES/DRESSINGS) ×1 IMPLANT
TOWEL GREEN STERILE (TOWEL DISPOSABLE) ×2 IMPLANT
TOWEL GREEN STERILE FF (TOWEL DISPOSABLE) ×2 IMPLANT
TRAP SPECIMEN MUCOUS 40CC (MISCELLANEOUS) IMPLANT
TRAY FOLEY MTR SLVR 16FR STAT (SET/KITS/TRAYS/PACK) ×2 IMPLANT
WATER STERILE IRR 1000ML POUR (IV SOLUTION) ×2 IMPLANT

## 2019-04-05 NOTE — Anesthesia Postprocedure Evaluation (Signed)
Anesthesia Post Note  Patient: Azeez Dunker Cournoyer  Procedure(s) Performed: LUMBAR TWO- LUMBAR FOUR LUMBAR DECOMPRESSION, LUMBAR TWO- LUMBAR THREE, LUMBAR THREE- LUMBAR FOUR POSTERIOR LUMBAR INTERBODY FUSION, LUMBAR TWO- LUMBAR FOUR POSTERIOR LATERAL ARTHRODESIS (N/A Back)     Patient location during evaluation: PACU Anesthesia Type: General Level of consciousness: awake and alert Pain management: pain level controlled Vital Signs Assessment: post-procedure vital signs reviewed and stable Respiratory status: spontaneous breathing, nonlabored ventilation, respiratory function stable and patient connected to nasal cannula oxygen Cardiovascular status: blood pressure returned to baseline and stable Postop Assessment: no apparent nausea or vomiting Anesthetic complications: no    Last Vitals:  Vitals:   04/05/19 1530 04/05/19 1554  BP:  114/65  Pulse:  62  Resp:  19  Temp: 36.4 C 36.6 C  SpO2:  93%    Last Pain:  Vitals:   04/05/19 1554  TempSrc: Oral  PainSc:                  Effie Berkshire

## 2019-04-05 NOTE — Anesthesia Procedure Notes (Signed)
Procedure Name: Intubation Date/Time: 04/05/2019 7:34 AM Performed by: Wilburn Cornelia, CRNA Pre-anesthesia Checklist: Patient identified, Emergency Drugs available, Suction available, Patient being monitored and Timeout performed Patient Re-evaluated:Patient Re-evaluated prior to induction Oxygen Delivery Method: Circle system utilized Preoxygenation: Pre-oxygenation with 100% oxygen Induction Type: IV induction Ventilation: Mask ventilation without difficulty Laryngoscope Size: Mac and 4 Grade View: Grade I Tube type: Oral Tube size: 7.5 mm Number of attempts: 1 Airway Equipment and Method: Stylet Placement Confirmation: ETT inserted through vocal cords under direct vision,  positive ETCO2,  CO2 detector and breath sounds checked- equal and bilateral Secured at: 22 cm Tube secured with: Tape Dental Injury: Teeth and Oropharynx as per pre-operative assessment

## 2019-04-05 NOTE — Transfer of Care (Signed)
Immediate Anesthesia Transfer of Care Note  Patient: Jose Kline  Procedure(s) Performed: LUMBAR TWO- LUMBAR FOUR LUMBAR DECOMPRESSION, LUMBAR TWO- LUMBAR THREE, LUMBAR THREE- LUMBAR FOUR POSTERIOR LUMBAR INTERBODY FUSION, LUMBAR TWO- LUMBAR FOUR POSTERIOR LATERAL ARTHRODESIS (N/A Back)  Patient Location: PACU  Anesthesia Type:General  Level of Consciousness: awake and alert   Airway & Oxygen Therapy: Patient Spontanous Breathing and Patient connected to nasal cannula oxygen  Post-op Assessment: Report given to RN and Post -op Vital signs reviewed and stable  Post vital signs: Reviewed and stable  Last Vitals:  Vitals Value Taken Time  BP 121/76 04/05/19 1400  Temp    Pulse 77 04/05/19 1401  Resp 12 04/05/19 1401  SpO2 100 % 04/05/19 1401  Vitals shown include unvalidated device data.  Last Pain:  Vitals:   04/05/19 0642  TempSrc:   PainSc: 4       Patients Stated Pain Goal: 3 (83/41/96 2229)  Complications: No apparent anesthesia complications

## 2019-04-05 NOTE — Progress Notes (Signed)
Vitals:   04/05/19 1500 04/05/19 1515 04/05/19 1530 04/05/19 1554  BP: 101/68 114/68  114/65  Pulse: 65 67  62  Resp: (!) 8 (!) 7  19  Temp:   97.6 F (36.4 C) 97.8 F (36.6 C)  TempSrc:    Oral  SpO2: 91% 96%  93%  Weight:      Height:        CBC Recent Labs    04/03/19 1353  WBC 6.4  HGB 13.9  HCT 41.2  PLT 251   BMET Recent Labs    04/03/19 1353  NA 139  K 3.5  CL 105  CO2 25  GLUCOSE 102*  BUN 18  CREATININE 0.98  CALCIUM 9.1    Patient resting in bed, has some mild nausea, which is settled.  Comfortable.  Moving all extremities well.  Dressing clean and dry.  Has not yet ambulated, Foley to straight drainage.  Plan: Encouraged to progress to ambulation once anesthesia has further cleared.  Will DC Foley once up and ambulating.  Continue to progress through postoperative recovery.  Hosie Spangle, MD 04/05/2019, 4:15 PM

## 2019-04-05 NOTE — H&P (Signed)
Subjective: Patient is a 68 y.o. left-handed white male who is admitted for treatment of multilevel lumbar stenosis, spondylosis, and degenerative disc disease with disabling left lumbar radiculopathy.  Patient's been having symptoms for the 2 months.  He has been treated with Advil, Aleve, and diclofenac without relief.  He had a previous L1-2 arthrodesis in 2012 and a left L3-4 lumbar laminotomy and resection of a synovial cyst in 2010.  Symptomatically has had severe pain in the left buttock, lateral left hip, and into the left knee.  He has a sense of weakness in the left lower extremity.  Exam shows weakness of the left iliopsoas.  Patient is admitted now for an L2-L4 decompression including laminectomy, facetectomy, and foraminotomies, and an L2-L4 stabilization including L2-3 and L3-4 posterior lumbar interbody arthrodesis with interbody implants and bone graft and an L2-L4 posterior lateral arthrodesis with posterior instrumentation and bone graft.  Past Medical History:  Diagnosis Date  . Anxiety    after wife passed away  . DDD (degenerative disc disease), lumbosacral   . GERD (gastroesophageal reflux disease)   . Headache    migraines that cause him to throw up but none recently  . Hyperlipidemia   . Hypertension     Past Surgical History:  Procedure Laterality Date  . EYE SURGERY Bilateral    cataract extraction with implants  . HERNIA REPAIR     umbilical  . INGUINAL HERNIA REPAIR Right 12/29/2017   Procedure: HERNIA REPAIR INGUINAL ADULT;  Surgeon: Herbert Pun, MD;  Location: [facility] ORS;  Service: General;  Laterality: Right;  . INSERTION OF MESH Right 12/29/2017   Procedure: INSERTION OF MESH;  Surgeon: Herbert Pun, MD;  Location: [facility] ORS;  Service: General;  Laterality: Right;  . IR GENERIC HISTORICAL  12/25/2014   IR RADIOLOGIST EVAL & MGMT 12/25/2014 Greggory Keen, MD [facility] INTERV RAD  . SPINE SURGERY  04/2011   1990's was first back surgery.  2 rods in middle  of back. Disc degenerative disease  . TONSILLECTOMY AND ADENOIDECTOMY     as a child    Medications Prior to Admission  Medication Sig Dispense Refill Last Dose  . hydrochlorothiazide (MICROZIDE) 12.5 MG capsule Take 12.5 mg by mouth daily.   04/04/2019 at Unknown time  . lovastatin (MEVACOR) 20 MG tablet Take 20 mg by mouth daily.    04/04/2019 at Unknown time  . omeprazole (PRILOSEC) 40 MG capsule Take 40 mg by mouth daily as needed (heartburn).    Past Week at Unknown time  . triamcinolone cream (KENALOG) 0.1 % Apply 1 application topically daily as needed (rash).   99 Past Week at Unknown time  . sildenafil (REVATIO) 20 MG tablet Take 40 mg by mouth daily as needed (erectile dysfunction).      Allergies  Allergen Reactions  . Soap Rash    Can not tolerate scented soaps. Very sensitive to skin    Social History   Tobacco Use  . Smoking status: Current Every Day Smoker    Packs/day: 1.50    Years: 50.00    Pack years: 75.00    Types: Cigarettes    Start date: 12/25/1966  . Smokeless tobacco: Former Systems developer    Types: Snuff    Quit date: 10/25/1998  Substance Use Topics  . Alcohol use: Yes    Alcohol/week: 0.0 standard drinks    Comment: rare and occasionally before bed    Family History  Problem Relation Age of Onset  . Cancer Mother  breast  . Kidney disease Mother      Review of Systems Pertinent items noted in HPI and remainder of comprehensive ROS otherwise negative.  Objective: Vital signs in last 24 hours: Temp:  [97.9 F (36.6 C)] 97.9 F (36.6 C) (06/11 0625) Pulse Rate:  [63] 63 (06/11 0625) Resp:  [18] 18 (06/11 0625) BP: (143)/(81) 143/81 (06/11 0625) SpO2:  [98 %] 98 % (06/11 0625) Weight:  [71.7 kg] 71.7 kg (06/11 0625)  EXAM: Patient is a well-developed well-nourished male in no acute distress.   Lungs are clear to auscultation , the patient has symmetrical respiratory excursion. Heart has a regular rate and rhythm normal S1 and S2 no murmur.    Abdomen is soft nontender nondistended bowel sounds are present. Extremity examination shows no clubbing cyanosis or edema. Straight leg raising is negative bilaterally. Motor examination shows the left iliopsoas is 4/5, the right iliopsoas is 5/5, the remainder the lower extremity strength is 5/5 including the quadriceps, dorsiflexor, EHL, and plantar flexor bilaterally.  Sensation is intact to pinprick in the distal lower extremities.  Reflex examination shows the left quadriceps is minimal, right quadriceps 1, gastrocnemius are absent bilaterally.  Toes are downgoing bilaterally.  Gait and stance favors the left lower extremity.  Data Review:CBC    Component Value Date/Time   WBC 6.4 04/03/2019 1353   RBC 4.49 04/03/2019 1353   HGB 13.9 04/03/2019 1353   HCT 41.2 04/03/2019 1353   PLT 251 04/03/2019 1353   MCV 91.8 04/03/2019 1353   MCH 31.0 04/03/2019 1353   MCHC 33.7 04/03/2019 1353   RDW 13.7 04/03/2019 1353                          BMET    Component Value Date/Time   NA 139 04/03/2019 1353   K 3.5 04/03/2019 1353   CL 105 04/03/2019 1353   CO2 25 04/03/2019 1353   GLUCOSE 102 (H) 04/03/2019 1353   BUN 18 04/03/2019 1353   CREATININE 0.98 04/03/2019 1353   CALCIUM 9.1 04/03/2019 1353   GFRNONAA >60 04/03/2019 1353   GFRAA >60 04/03/2019 1353     Assessment/Plan: Patient with disabling left lumbar radiculopathy with out relief with medications who on exam has weakness of the left iliopsoas (4/5).  He has a good fusion at the L1-2 level but has significant multilevel degenerative disc and spondylosis L2-3 L3-4 L4-5 and L5-S1 he has a grade 1 dynamic degenerative spinal listhesis of L4 and 5 but the worst canal stenosis as well as neuroforaminal stenosis is seen at the L2-3 and L3-4 levels.  He is admitted now for an L2-L4 decompression and arthrodesis.  I've discussed with the patient the nature of his condition, the nature the surgical procedure, the typical length of surgery,  hospital stay, and overall recuperation, the limitations postoperatively, and risks of surgery. I discussed risks including risks of infection, bleeding, possibly need for transfusion, the risk of nerve root dysfunction with pain, weakness, numbness, or paresthesias, the risk of dural tear and CSF leakage and possible need for further surgery, the risk of failure of the arthrodesis and possibly for further surgery, the risk of anesthetic complications including myocardial infarction, stroke, pneumonia, and death. We discussed the need for postoperative immobilization in a lumbar brace. Understanding all this the patient does wish to proceed with surgery and is admitted for such.   Hosie Spangle, MD 04/05/2019 7:02 AM

## 2019-04-05 NOTE — Op Note (Signed)
04/05/2019  2:01 PM  PATIENT:  Jose Kline  68 y.o. male  PRE-OPERATIVE DIAGNOSIS: Multilevel, multifactorial lumbar stenosis with neurogenic claudication; lumbar spondylosis; lumbar degenerative disc disease; left lumbar radiculopathy with proximal left lower extremity weakness; status post previous lumbar fusion  POST-OPERATIVE DIAGNOSIS:  Multilevel, multifactorial lumbar stenosis with neurogenic claudication; lumbar spondylosis; lumbar degenerative disc disease; left lumbar radiculopathy with proximal left lower extremity weakness; status post previous lumbar fusion  PROCEDURE:  Procedure(s): 1) exploration of L1-2 arthrodesis and removal at L1-2 radius posterior instrumentation including locking caps, rods, cross connector, and L1 screws; 2) bilateral L2-L4 decompression including L2-L4 decompressive lumbar laminectomy, L2-3 and L3-4 facetectomies, foraminotomies for decompression of the stenotic compression of the exiting L2, L3, and L4 nerve roots with decompression beyond that required for interbody arthrodesis; bilateral L2-3 and L3-4 posterior lumbar interbody arthrodesis with Tritanium interbody implants, locally harvested morselized autograft, Vitoss BA with bone marrow aspirate, and infuse; bilateral L2-L4 posterior lateral arthrodesis with segmental radius posterior instrumentation, locally harvested morselized autograft, Vitoss BA with bone marrow aspirate, and infuse  SURGEON: Jovita Gamma, MD  ASSISTANTS: Erline Levine, MD  ANESTHESIA:   general  EBL:  Total I/O In: 2500 [I.V.:2150; Blood:100; IV Piggyback:250] Out: 525 [Urine:175; Blood:350]  BLOOD ADMINISTERED:100 CC CELLSAVER  COUNT:  Correct per nursing staff  DICTATION: Patient was brought to the operating room placed under general endotracheal anesthesia. The patient was turned to prone position, the thoracolumbar region was prepped with Betadine soap and solution and draped in a sterile fashion. The midline was  infiltrated with local anesthesia with epinephrine. A localizing x-ray was taken and then a midline incision was made through the existing midline incision and extended caudally.  It was carried down through the subcutaneous tissue, bipolar cautery and electrocautery were used to maintain hemostasis. Dissection was carried down to the lumbar fascia. The fascia was incised bilaterally and the paraspinal muscles were dissected with a spinous process and lamina in a subperiosteal fashion.  The cross-link at the L1-2 level was identified, and dissection was then carried out laterally exposing the rods and screws at the L1-2 level.  We explored the posterior elements of L1 and 2 and found a solid fusion at that level.  The locking caps were unlocked and removed, the cross-link and rods were removed, and the pedicle screws at L1 were removed.  The screw holes at L1 were filled with Surgifoam and Gelfoam with thrombin to establish hemostasis.   We then took another x-ray  for localization and the L2-3 and L3-4 levels were localized. Dissection was then carried out laterally over the L2-3 and L3-4 facet complexes and the transverse processes of L2, L3, and L4 were exposed and decorticated.  We then proceeded with a L2-L4 decompressive lumbar laminectomy using double-action rongeurs, the high-speed drill, and Kerrison punches.  Bone was saved to later be morselized and implanted as locally harvested morselized autograft (a total of about 35 cc).  Dissection was then carried out laterally including bilateral L2-3 and L3-4 facetectomies and foraminotomies with decompression of the stenotic compression of the exiting L2, L3, and L4 nerve roots bilaterally.  Once the decompression of the stenotic compression of the thecal sac and exiting nerve roots was completed we proceeded with the posterior lumbar interbody arthrodesis. The annulus at each level was incised bilaterally and the disc space entered. A thorough discectomy was  performed using pituitary rongeurs and curettes. Once the discectomy was completed we began to prepare the endplate surfaces, removing the  cartilaginous endplates surface. We then measured the height of the intervertebral disc space. We selected a 9 x 23 x 6 x 9 Tritanium interbody implants for the L2-3 level, and 8 x 23 x 6 x 9 Tritanium interbody implants for the L3-4 level.  The C-arm fluoroscope was then draped and brought in the field and we identified the pedicle entry points bilaterally at the L3 and L4 levels.  We used the existing radius screws at L2.  Each of the pedicles was probed, we aspirated bone marrow aspirate from the vertebral bodies, this was injected over two 10 cc strips of Vitoss BA. Then each of the pedicles was examined with the ball probe, good bony surfaces were found and no bony cuts were found. Each of the pedicles was then tapped with a 5.25 mm tap, again examined with the ball probe good threading was found and no bony cuts were found. We then placed 5.75 by 50 millimeter screws bilaterally at the L3 level and 5.75 by 45 millimeter screws bilaterally at the L4 level.  We then packed the interbody implants with Vitoss BA with bone marrow aspirate and infuse, and then placed the first implant at the L3-4 level on the right side, carefully retracting the thecal sac and nerve root medially. We then went back to the left side and packed the midline with locally harvested morselized autograft, Vitoss BA with bone marrow aspirate, and infuse, and then placed a second implant on the left side again retracting the thecal sac and nerve root medially. Additional Vitoss BA with bone marrow aspirate and infuse was packed lateral to the implants.  Then at the L2-3 level, we placed the first implant on the right side, carefully retracting the thecal sac and nerve root medially. We then went back to the left side and packed the midline with additional locally harvested morselized autograft,  Vitoss BA with bone marrow aspirate, and infuse, and then placed a second implant on the left side, again retracting the thecal sac and nerve root medially. Additional Vitoss BA with bone marrow aspirate and infuse was packed lateral to the implants.   We then packed the lateral gutter over the transverse processes and intertransverse space with locally harvested morselized autograft, Vitoss BA with bone marrow aspirate, and infuse. We then selected 80 mm straight rods, they were placed within the screw heads and secured with locking caps.  Once all 6 locking caps were placed final tightening was performed against a counter torque.  The wound had been irrigated multiple times during the procedure with saline solution and bacitracin solution, good hemostasis was established with a combination of bipolar cautery, Gelfoam with thrombin, and Surgifoam. Once good hemostasis was confirmed we proceeded with closure paraspinal muscles and deep fascia were closed in separate layers with interrupted undyed 1 Vicryl sutures the subcutaneous and subcuticular closed with interrupted inverted 2-0 undyed Vicryl sutures the skin edges were approximated with Dermabond.  The wound was dressed with sterile gauze and Hypafix.  Following surgery the patient was turned back to the supine position to be reversed and the anesthetic extubated and transferred to the recovery room for further care.   PLAN OF CARE: Admit to inpatient   PATIENT DISPOSITION:  PACU - hemodynamically stable.   Delay start of Pharmacological VTE agent (>24hrs) due to surgical blood loss or risk of bleeding:  yes

## 2019-04-05 NOTE — Anesthesia Preprocedure Evaluation (Addendum)
Anesthesia Evaluation  Patient identified by MRN, date of birth, ID band Patient awake    Reviewed: Allergy & Precautions, NPO status , Patient's Chart, lab work & pertinent test results  Airway Mallampati: I  TM Distance: >3 FB Neck ROM: Full    Dental  (+) Upper Dentures, Lower Dentures   Pulmonary Current Smoker,    breath sounds clear to auscultation       Cardiovascular hypertension, Pt. on medications  Rhythm:Regular Rate:Normal     Neuro/Psych  Headaches, Anxiety    GI/Hepatic Neg liver ROS, GERD  Medicated,  Endo/Other  negative endocrine ROS  Renal/GU negative Renal ROS     Musculoskeletal  (+) Arthritis ,   Abdominal Normal abdominal exam  (+)   Peds  Hematology negative hematology ROS (+)   Anesthesia Other Findings   Reproductive/Obstetrics                            Lab Results  Component Value Date   WBC 6.4 04/03/2019   HGB 13.9 04/03/2019   HCT 41.2 04/03/2019   MCV 91.8 04/03/2019   PLT 251 04/03/2019   Lab Results  Component Value Date   CREATININE 0.98 04/03/2019   BUN 18 04/03/2019   NA 139 04/03/2019   K 3.5 04/03/2019   CL 105 04/03/2019   CO2 25 04/03/2019   No results found for: INR, PROTIME  EKG: sinus bradycardia, 1st degree AV block.  Anesthesia Physical Anesthesia Plan  ASA: II  Anesthesia Plan: General   Post-op Pain Management:    Induction: Intravenous  PONV Risk Score and Plan: 2 and Midazolam, Ondansetron and Dexamethasone  Airway Management Planned: Oral ETT  Additional Equipment: None  Intra-op Plan:   Post-operative Plan: Extubation in OR  Informed Consent: I have reviewed the patients History and Physical, chart, labs and discussed the procedure including the risks, benefits and alternatives for the proposed anesthesia with the patient or authorized representative who has indicated his/her understanding and acceptance.      Dental advisory given  Plan Discussed with: CRNA  Anesthesia Plan Comments: (COVID-19 Labs  No results for input(s): DDIMER, FERRITIN, LDH, CRP in the last 72 hours.  Lab Results      Component                Value               Date                      [id number]              NOT DETECTED        04/03/2019            )      Anesthesia Quick Evaluation

## 2019-04-06 MED ORDER — HYDROCODONE-ACETAMINOPHEN 5-325 MG PO TABS: 1.0000 | ORAL_TABLET | ORAL | 0 refills | Status: DC | PRN

## 2019-04-06 NOTE — Progress Notes (Signed)
Discharge orders/instructions/education/AVS/Rx given to patient and verbalized understanding. Pain is mild to moderate and controlled by PRN medications. no redness, no swelling no drainage noted on incision site. Patient voiding well, ambulating well independently. NO  Complaints noted. Discharged via wheelchair.

## 2019-04-06 NOTE — Discharge Summary (Signed)
Physician Discharge Summary  Patient ID: Jose Kline MRN: [id number] DOB/AGE: 1951/07/06 68 y.o.  Admit date: 04/05/2019 Discharge date: 04/06/2019  Admission Diagnoses:  Multilevel, multifactorial lumbar stenosis with neurogenic claudication; lumbar spondylosis; lumbar degenerative disc disease; left lumbar radiculopathy with proximal left lower extremity weakness; status post previous lumbar fusion  Discharge Diagnoses:  Multilevel, multifactorial lumbar stenosis with neurogenic claudication; lumbar spondylosis; lumbar degenerative disc disease; left lumbar radiculopathy with proximal left lower extremity weakness; status post previous lumbar fusion Active Problems:   Lumbar stenosis with neurogenic claudication   Discharged Condition: good  Hospital Course: Patient was admitted, underwent an L2-L4 decompression arthrodesis, his L1-2 arthrodesis was explored as well.  He has done well following surgery with excellent relief of his neurogenic claudication and lumbar radiculopathy.  He is up and ambulating actively in the halls.  His dressing was removed, and his incision is healing nicely.  There is no erythema, ecchymosis, swelling, or drainage.  He is voiding well.  He is asking to be discharged home.  We have given him instructions regarding wound care and activities following discharge.  He is scheduled for follow-up with me in 3 weeks with x-rays at the office.  Discharge Exam: Blood pressure 123/73, pulse 61, temperature 98.1 F (36.7 C), temperature source Oral, resp. rate 18, height 5\' 6"  (1.676 m), weight 71.7 kg, SpO2 98 %.  Disposition: Discharge disposition: 01-Home or Self Care       Discharge Instructions    Discharge wound care:   Complete by: As directed    Leave the wound open to air. Shower daily with the wound uncovered. Water and soapy water should run over the incision area. Do not wash directly on the incision for 2 weeks. Remove the glue after 2 weeks.   Driving Restrictions   Complete by: As directed    No driving for 2 weeks. May ride in the car locally now. May begin to drive locally in 2 weeks.   Other Restrictions   Complete by: As directed    Walk gradually increasing distances out in the fresh air at least twice a day. Walking additional 6 times inside the house, gradually increasing distances, daily. No bending, lifting, or twisting. Perform activities between shoulder and waist height (that is at counter height when standing or table height when sitting).     Allergies as of 04/06/2019      Reactions   Soap Rash   Can not tolerate scented soaps. Very sensitive to skin      Medication List    TAKE these medications   hydrochlorothiazide 12.5 MG capsule Commonly known as: MICROZIDE Take 12.5 mg by mouth daily.   HYDROcodone-acetaminophen 5-325 MG tablet Commonly known as: NORCO/VICODIN Take 1-2 tablets by mouth every 4 (four) hours as needed for moderate pain.   lovastatin 20 MG tablet Commonly known as: MEVACOR Take 20 mg by mouth daily.   omeprazole 40 MG capsule Commonly known as: PRILOSEC Take 40 mg by mouth daily as needed (heartburn).   sildenafil 20 MG tablet Commonly known as: REVATIO Take 40 mg by mouth daily as needed (erectile dysfunction).   triamcinolone cream 0.1 % Commonly known as: KENALOG Apply 1 application topically daily as needed (rash).            Discharge Care Instructions  (From admission, onward)         Start     Ordered   04/06/19 0000  Discharge wound care:    Comments: Leave the  wound open to air. Shower daily with the wound uncovered. Water and soapy water should run over the incision area. Do not wash directly on the incision for 2 weeks. Remove the glue after 2 weeks.   04/06/19 0814           Signed: Hosie Spangle 04/06/2019, 8:15 AM

## 2019-04-09 MED FILL — Heparin Sodium (Porcine) Inj 1000 Unit/ML: INTRAMUSCULAR | Qty: 30 | Status: AC

## 2019-04-09 MED FILL — Sodium Chloride Irrigation Soln 0.9%: Qty: 3000 | Status: AC

## 2019-04-09 MED FILL — Sodium Chloride IV Soln 0.9%: INTRAVENOUS | Qty: 1000 | Status: AC

## 2019-11-27 ENCOUNTER — Other Ambulatory Visit: Payer: Self-pay | Admitting: Internal Medicine

## 2019-11-27 ENCOUNTER — Ambulatory Visit
Admission: RE | Admit: 2019-11-27 | Discharge: 2019-11-27 | Disposition: A | Discharge status: Home / Self Care | Payer: BC Managed Care – PPO | Source: Ambulatory Visit | Attending: Internal Medicine | Admitting: Internal Medicine

## 2019-11-27 DIAGNOSIS — R0989 Other specified symptoms and signs involving the circulatory and respiratory systems: Secondary | ICD-10-CM

## 2020-04-03 ENCOUNTER — Other Ambulatory Visit: Payer: Self-pay | Admitting: Student

## 2020-04-03 DIAGNOSIS — M21371 Foot drop, right foot: Secondary | ICD-10-CM

## 2020-05-07 ENCOUNTER — Other Ambulatory Visit: Payer: BC Managed Care – PPO

## 2020-05-08 ENCOUNTER — Other Ambulatory Visit: Payer: Self-pay | Admitting: Neurosurgery

## 2020-05-12 ENCOUNTER — Other Ambulatory Visit (HOSPITAL_COMMUNITY)
Admission: RE | Admit: 2020-05-12 | Discharge: 2020-05-12 | Disposition: A | Discharge status: Home / Self Care | Payer: Medicare Other | Source: Ambulatory Visit | Attending: Neurosurgery | Admitting: Neurosurgery

## 2020-05-12 DIAGNOSIS — Z20822 Contact with and (suspected) exposure to covid-19: Secondary | ICD-10-CM | POA: Insufficient documentation

## 2020-05-12 DIAGNOSIS — Z01812 Encounter for preprocedural laboratory examination: Secondary | ICD-10-CM | POA: Insufficient documentation

## 2020-05-12 LAB — SARS CORONAVIRUS 2 (TAT 6-24 HRS): SARS Coronavirus 2: NEGATIVE

## 2020-05-13 ENCOUNTER — Encounter (HOSPITAL_COMMUNITY): Payer: Self-pay | Admitting: Neurosurgery

## 2020-05-13 ENCOUNTER — Other Ambulatory Visit: Payer: Self-pay

## 2020-05-13 NOTE — Progress Notes (Signed)
Spoke with pt for pre-op call. Pt has hx of HTN, denies cardiac hx or Diabetes.   Covid test done 05/12/20 and it's negative. Pt states he's been in quarantine since the test was done and he understands that he stays in quarantine until he comes to the hospital.

## 2020-05-14 ENCOUNTER — Other Ambulatory Visit: Payer: Self-pay

## 2020-05-14 ENCOUNTER — Inpatient Hospital Stay (HOSPITAL_COMMUNITY): Payer: Medicare Other

## 2020-05-14 ENCOUNTER — Encounter (HOSPITAL_COMMUNITY): Payer: Self-pay | Admitting: Neurosurgery

## 2020-05-14 ENCOUNTER — Encounter (HOSPITAL_COMMUNITY)
Admission: RE | Disposition: A | Discharge status: Home Health | Payer: Self-pay | Source: Home / Self Care | Attending: Neurosurgery

## 2020-05-14 ENCOUNTER — Inpatient Hospital Stay (HOSPITAL_COMMUNITY)
Admission: RE | Admit: 2020-05-14 | Discharge: 2020-05-16 | DRG: 455 | Disposition: A | Discharge status: Home Health | Payer: Medicare Other | Attending: Neurosurgery | Admitting: Neurosurgery

## 2020-05-14 DIAGNOSIS — Z7289 Other problems related to lifestyle: Secondary | ICD-10-CM | POA: Diagnosis not present

## 2020-05-14 DIAGNOSIS — M4316 Spondylolisthesis, lumbar region: Secondary | ICD-10-CM | POA: Diagnosis present

## 2020-05-14 DIAGNOSIS — Z981 Arthrodesis status: Secondary | ICD-10-CM

## 2020-05-14 DIAGNOSIS — K219 Gastro-esophageal reflux disease without esophagitis: Secondary | ICD-10-CM | POA: Diagnosis present

## 2020-05-14 DIAGNOSIS — I10 Essential (primary) hypertension: Secondary | ICD-10-CM | POA: Diagnosis present

## 2020-05-14 DIAGNOSIS — Z20822 Contact with and (suspected) exposure to covid-19: Secondary | ICD-10-CM | POA: Diagnosis present

## 2020-05-14 DIAGNOSIS — M21371 Foot drop, right foot: Secondary | ICD-10-CM | POA: Diagnosis present

## 2020-05-14 DIAGNOSIS — Z91048 Other nonmedicinal substance allergy status: Secondary | ICD-10-CM

## 2020-05-14 DIAGNOSIS — F17210 Nicotine dependence, cigarettes, uncomplicated: Secondary | ICD-10-CM | POA: Diagnosis present

## 2020-05-14 DIAGNOSIS — E785 Hyperlipidemia, unspecified: Secondary | ICD-10-CM | POA: Diagnosis present

## 2020-05-14 DIAGNOSIS — F419 Anxiety disorder, unspecified: Secondary | ICD-10-CM | POA: Diagnosis present

## 2020-05-14 DIAGNOSIS — Z419 Encounter for procedure for purposes other than remedying health state, unspecified: Secondary | ICD-10-CM

## 2020-05-14 DIAGNOSIS — Z79899 Other long term (current) drug therapy: Secondary | ICD-10-CM

## 2020-05-14 DIAGNOSIS — M48061 Spinal stenosis, lumbar region without neurogenic claudication: Secondary | ICD-10-CM | POA: Diagnosis present

## 2020-05-14 HISTORY — DX: Other complications of anesthesia, initial encounter: T88.59XA

## 2020-05-14 LAB — BASIC METABOLIC PANEL
Anion gap: 9 (ref 5–15)
BUN: 17 mg/dL (ref 8–23)
CO2: 27 mmol/L (ref 22–32)
Calcium: 9.1 mg/dL (ref 8.9–10.3)
Chloride: 102 mmol/L (ref 98–111)
Creatinine, Ser: 1 mg/dL (ref 0.61–1.24)
GFR calc Af Amer: >60 mL/min (ref >60)
GFR calc non Af Amer: >60 mL/min (ref >60)
Glucose, Bld: 103 mg/dL — ABNORMAL HIGH (ref 70–99)
Potassium: 3.4 mmol/L — ABNORMAL LOW (ref 3.5–5.1)
Sodium: 138 mmol/L (ref 135–145)

## 2020-05-14 LAB — TYPE AND SCREEN
ABO/RH(D): A POS
Antibody Screen: NEGATIVE

## 2020-05-14 LAB — CBC
HCT: 40.5 % (ref 39.0–52.0)
Hemoglobin: 13.6 g/dL (ref 13.0–17.0)
MCH: 31.9 pg (ref 26.0–34.0)
MCHC: 33.6 g/dL (ref 30.0–36.0)
MCV: 95.1 fL (ref 80.0–100.0)
Platelets: 216 10*3/uL (ref 150–400)
RBC: 4.26 MIL/uL (ref 4.22–5.81)
RDW: 13.7 % (ref 11.5–15.5)
WBC: 6.6 10*3/uL (ref 4.0–10.5)
nRBC: 0 % (ref 0.0–0.2)

## 2020-05-14 SURGERY — POSTERIOR LUMBAR FUSION 2 LEVEL
Anesthesia: General | Site: Spine Lumbar

## 2020-05-14 MED ORDER — LIDOCAINE-EPINEPHRINE 1 %-1:100000 IJ SOLN
INTRAMUSCULAR | Status: DC | PRN
  Administered 2020-05-14: 10 mL

## 2020-05-14 MED ORDER — LACTATED RINGERS IV SOLN
INTRAVENOUS | Status: DC | PRN
Start: 2020-05-14 — End: 2020-05-14

## 2020-05-14 MED ORDER — LIDOCAINE-EPINEPHRINE 1 %-1:100000 IJ SOLN
INTRAMUSCULAR | Status: AC
  Filled 2020-05-14: qty 1

## 2020-05-14 MED ORDER — ACETAMINOPHEN 10 MG/ML IV SOLN
INTRAVENOUS | Status: AC
  Filled 2020-05-14: qty 100

## 2020-05-14 MED ORDER — BUPIVACAINE LIPOSOME 1.3 % IJ SUSP
20.0000 mL | Freq: Once | INTRAMUSCULAR | Status: DC
  Filled 2020-05-14: qty 20

## 2020-05-14 MED ORDER — PANTOPRAZOLE SODIUM 40 MG IV SOLR: 40.0000 mg | Freq: Every day | INTRAVENOUS | Status: DC

## 2020-05-14 MED ORDER — SODIUM CHLORIDE 0.9% FLUSH: 3.0000 mL | INTRAVENOUS | Status: DC | PRN

## 2020-05-14 MED ORDER — LISINOPRIL 20 MG PO TABS
20.0000 mg | ORAL_TABLET | Freq: Every day | ORAL | Status: DC
  Administered 2020-05-14 – 2020-05-16 (×3): 20 mg via ORAL
  Filled 2020-05-14 (×3): qty 1

## 2020-05-14 MED ORDER — PHENYLEPHRINE HCL-NACL 10-0.9 MG/250ML-% IV SOLN
INTRAVENOUS | Status: DC | PRN
  Administered 2020-05-14: 20 ug/min via INTRAVENOUS

## 2020-05-14 MED ORDER — FENTANYL CITRATE (PF) 250 MCG/5ML IJ SOLN
INTRAMUSCULAR | Status: AC
  Filled 2020-05-14: qty 5

## 2020-05-14 MED ORDER — CEFAZOLIN SODIUM-DEXTROSE 2-4 GM/100ML-% IV SOLN
2.0000 g | Freq: Three times a day (TID) | INTRAVENOUS | Status: DC
  Administered 2020-05-14 – 2020-05-16 (×5): 2 g via INTRAVENOUS
  Filled 2020-05-14 (×5): qty 100

## 2020-05-14 MED ORDER — CHLORHEXIDINE GLUCONATE CLOTH 2 % EX PADS: 6.0000 | MEDICATED_PAD | Freq: Once | CUTANEOUS | Status: DC

## 2020-05-14 MED ORDER — EPHEDRINE SULFATE-NACL 50-0.9 MG/10ML-% IV SOSY
PREFILLED_SYRINGE | INTRAVENOUS | Status: DC | PRN
  Administered 2020-05-14 (×2): 5 mg via INTRAVENOUS

## 2020-05-14 MED ORDER — ACETAMINOPHEN 325 MG PO TABS
650.0000 mg | ORAL_TABLET | ORAL | Status: DC | PRN
  Administered 2020-05-14 – 2020-05-16 (×5): 650 mg via ORAL
  Filled 2020-05-14 (×4): qty 2

## 2020-05-14 MED ORDER — ONDANSETRON HCL 4 MG/2ML IJ SOLN
INTRAMUSCULAR | Status: AC
  Filled 2020-05-14: qty 2

## 2020-05-14 MED ORDER — ONDANSETRON HCL 4 MG PO TABS: 4.0000 mg | ORAL_TABLET | Freq: Four times a day (QID) | ORAL | Status: DC | PRN

## 2020-05-14 MED ORDER — THROMBIN 20000 UNITS EX SOLR
CUTANEOUS | Status: AC
  Filled 2020-05-14: qty 20000

## 2020-05-14 MED ORDER — DEXAMETHASONE SODIUM PHOSPHATE 10 MG/ML IJ SOLN
INTRAMUSCULAR | Status: DC | PRN
  Administered 2020-05-14: 10 mg via INTRAVENOUS

## 2020-05-14 MED ORDER — ALUM & MAG HYDROXIDE-SIMETH 200-200-20 MG/5ML PO SUSP: 30.0000 mL | Freq: Four times a day (QID) | ORAL | Status: DC | PRN

## 2020-05-14 MED ORDER — LIDOCAINE 2% (20 MG/ML) 5 ML SYRINGE
INTRAMUSCULAR | Status: DC | PRN
  Administered 2020-05-14: 100 mg via INTRAVENOUS

## 2020-05-14 MED ORDER — ONDANSETRON HCL 4 MG/2ML IJ SOLN: 4.0000 mg | Freq: Four times a day (QID) | INTRAMUSCULAR | Status: DC | PRN

## 2020-05-14 MED ORDER — DEXAMETHASONE SODIUM PHOSPHATE 10 MG/ML IJ SOLN
INTRAMUSCULAR | Status: AC
  Filled 2020-05-14: qty 1

## 2020-05-14 MED ORDER — BUPIVACAINE LIPOSOME 1.3 % IJ SUSP
INTRAMUSCULAR | Status: DC | PRN
  Administered 2020-05-14: 20 mL

## 2020-05-14 MED ORDER — PHENOL 1.4 % MT LIQD: 1.0000 | OROMUCOSAL | Status: DC | PRN

## 2020-05-14 MED ORDER — CEFAZOLIN SODIUM 1 G IJ SOLR
INTRAMUSCULAR | Status: AC
  Filled 2020-05-14: qty 20

## 2020-05-14 MED ORDER — PRAVASTATIN SODIUM 10 MG PO TABS
10.0000 mg | ORAL_TABLET | Freq: Every day | ORAL | Status: DC
  Administered 2020-05-14 – 2020-05-15 (×2): 10 mg via ORAL
  Filled 2020-05-14 (×2): qty 1

## 2020-05-14 MED ORDER — SODIUM CHLORIDE 0.9% FLUSH
3.0000 mL | Freq: Two times a day (BID) | INTRAVENOUS | Status: DC
  Administered 2020-05-14 – 2020-05-16 (×4): 3 mL via INTRAVENOUS

## 2020-05-14 MED ORDER — ORAL CARE MOUTH RINSE: 15.0000 mL | Freq: Once | OROMUCOSAL | Status: AC

## 2020-05-14 MED ORDER — THROMBIN 20000 UNITS EX SOLR: CUTANEOUS | Status: DC | PRN

## 2020-05-14 MED ORDER — LACTATED RINGERS IV SOLN: INTRAVENOUS | Status: DC

## 2020-05-14 MED ORDER — MIDAZOLAM HCL 2 MG/2ML IJ SOLN
INTRAMUSCULAR | Status: AC
  Filled 2020-05-14: qty 2

## 2020-05-14 MED ORDER — 0.9 % SODIUM CHLORIDE (POUR BTL) OPTIME
TOPICAL | Status: DC | PRN
  Administered 2020-05-14: 1000 mL

## 2020-05-14 MED ORDER — ONDANSETRON HCL 4 MG/2ML IJ SOLN
INTRAMUSCULAR | Status: DC | PRN
  Administered 2020-05-14: 4 mg via INTRAVENOUS

## 2020-05-14 MED ORDER — PROPOFOL 10 MG/ML IV BOLUS
INTRAVENOUS | Status: AC
  Filled 2020-05-14: qty 20

## 2020-05-14 MED ORDER — ALBUMIN HUMAN 5 % IV SOLN
INTRAVENOUS | Status: DC | PRN
Start: 2020-05-14 — End: 2020-05-14

## 2020-05-14 MED ORDER — CYCLOBENZAPRINE HCL 10 MG PO TABS
10.0000 mg | ORAL_TABLET | Freq: Three times a day (TID) | ORAL | Status: DC | PRN
  Administered 2020-05-14 – 2020-05-15 (×3): 10 mg via ORAL
  Filled 2020-05-14 (×4): qty 1

## 2020-05-14 MED ORDER — ROCURONIUM BROMIDE 10 MG/ML (PF) SYRINGE
PREFILLED_SYRINGE | INTRAVENOUS | Status: AC
  Filled 2020-05-14: qty 10

## 2020-05-14 MED ORDER — GLYCOPYRROLATE PF 0.2 MG/ML IJ SOSY
PREFILLED_SYRINGE | INTRAMUSCULAR | Status: AC
  Filled 2020-05-14: qty 1

## 2020-05-14 MED ORDER — SUGAMMADEX SODIUM 200 MG/2ML IV SOLN
INTRAVENOUS | Status: DC | PRN
  Administered 2020-05-14: 200 mg via INTRAVENOUS

## 2020-05-14 MED ORDER — ACETAMINOPHEN 10 MG/ML IV SOLN
INTRAVENOUS | Status: DC | PRN
  Administered 2020-05-14: 1000 mg via INTRAVENOUS

## 2020-05-14 MED ORDER — TRIAMCINOLONE ACETONIDE 0.1 % EX CREA
1.0000 "application " | TOPICAL_CREAM | Freq: Every day | CUTANEOUS | Status: DC | PRN
  Filled 2020-05-14: qty 15

## 2020-05-14 MED ORDER — PANTOPRAZOLE SODIUM 40 MG PO TBEC: 40.0000 mg | DELAYED_RELEASE_TABLET | Freq: Every day | ORAL | Status: DC

## 2020-05-14 MED ORDER — HYDROMORPHONE HCL 1 MG/ML IJ SOLN
0.5000 mg | INTRAMUSCULAR | Status: DC | PRN
  Administered 2020-05-14: 0.5 mg via INTRAVENOUS
  Filled 2020-05-14: qty 0.5

## 2020-05-14 MED ORDER — CEFAZOLIN SODIUM-DEXTROSE 2-4 GM/100ML-% IV SOLN
2.0000 g | INTRAVENOUS | Status: AC
  Administered 2020-05-14 (×2): 2 g via INTRAVENOUS

## 2020-05-14 MED ORDER — CEFAZOLIN SODIUM-DEXTROSE 2-4 GM/100ML-% IV SOLN
INTRAVENOUS | Status: AC
  Filled 2020-05-14: qty 100

## 2020-05-14 MED ORDER — ROCURONIUM BROMIDE 10 MG/ML (PF) SYRINGE
PREFILLED_SYRINGE | INTRAVENOUS | Status: DC | PRN
  Administered 2020-05-14 (×3): 10 mg via INTRAVENOUS
  Administered 2020-05-14: 100 mg via INTRAVENOUS
  Administered 2020-05-14: 10 mg via INTRAVENOUS

## 2020-05-14 MED ORDER — CHLORHEXIDINE GLUCONATE 0.12 % MT SOLN
15.0000 mL | Freq: Once | OROMUCOSAL | Status: AC
  Administered 2020-05-14: 15 mL via OROMUCOSAL
  Filled 2020-05-14: qty 15

## 2020-05-14 MED ORDER — MENTHOL 3 MG MT LOZG: 1.0000 | LOZENGE | OROMUCOSAL | Status: DC | PRN

## 2020-05-14 MED ORDER — OXYCODONE HCL 5 MG PO TABS
10.0000 mg | ORAL_TABLET | ORAL | Status: DC | PRN
  Administered 2020-05-14 – 2020-05-16 (×10): 10 mg via ORAL
  Filled 2020-05-14 (×10): qty 2

## 2020-05-14 MED ORDER — FENTANYL CITRATE (PF) 250 MCG/5ML IJ SOLN
INTRAMUSCULAR | Status: DC | PRN
  Administered 2020-05-14: 25 ug via INTRAVENOUS
  Administered 2020-05-14: 50 ug via INTRAVENOUS
  Administered 2020-05-14: 150 ug via INTRAVENOUS
  Administered 2020-05-14: 50 ug via INTRAVENOUS

## 2020-05-14 MED ORDER — THROMBIN 5000 UNITS EX SOLR
CUTANEOUS | Status: AC
  Filled 2020-05-14: qty 5000

## 2020-05-14 MED ORDER — PANTOPRAZOLE SODIUM 40 MG PO TBEC
40.0000 mg | DELAYED_RELEASE_TABLET | Freq: Every day | ORAL | Status: DC
  Administered 2020-05-14 – 2020-05-15 (×2): 40 mg via ORAL
  Filled 2020-05-14 (×2): qty 1

## 2020-05-14 MED ORDER — MIDAZOLAM HCL 2 MG/2ML IJ SOLN
INTRAMUSCULAR | Status: DC | PRN
  Administered 2020-05-14: 1 mg via INTRAVENOUS

## 2020-05-14 MED ORDER — LISINOPRIL-HYDROCHLOROTHIAZIDE 20-12.5 MG PO TABS: 1.0000 | ORAL_TABLET | Freq: Every day | ORAL | Status: DC

## 2020-05-14 MED ORDER — HYDROCHLOROTHIAZIDE 12.5 MG PO CAPS
12.5000 mg | ORAL_CAPSULE | Freq: Every day | ORAL | Status: DC
  Administered 2020-05-14 – 2020-05-16 (×3): 12.5 mg via ORAL
  Filled 2020-05-14 (×3): qty 1

## 2020-05-14 MED ORDER — SODIUM CHLORIDE 0.9 % IV SOLN: 250.0000 mL | INTRAVENOUS | Status: DC

## 2020-05-14 MED ORDER — PROPOFOL 10 MG/ML IV BOLUS
INTRAVENOUS | Status: DC | PRN
  Administered 2020-05-14: 120 mg via INTRAVENOUS

## 2020-05-14 MED ORDER — ASPIRIN-CAFFEINE 845-65 MG PO PACK: 1.0000 | PACK | Freq: Every day | ORAL | Status: DC | PRN

## 2020-05-14 MED ORDER — ACETAMINOPHEN 650 MG RE SUPP: 650.0000 mg | RECTAL | Status: DC | PRN

## 2020-05-14 MED ORDER — SODIUM CHLORIDE 0.9 % IV SOLN: INTRAVENOUS | Status: DC | PRN

## 2020-05-14 MED ORDER — GLYCOPYRROLATE 0.2 MG/ML IJ SOLN
INTRAMUSCULAR | Status: DC | PRN
  Administered 2020-05-14: .1 mg via INTRAVENOUS

## 2020-05-14 MED ORDER — DEXAMETHASONE SODIUM PHOSPHATE 10 MG/ML IJ SOLN: 10.0000 mg | Freq: Once | INTRAMUSCULAR | Status: DC

## 2020-05-14 MED ORDER — SILDENAFIL CITRATE 20 MG PO TABS
40.0000 mg | ORAL_TABLET | Freq: Every day | ORAL | Status: DC | PRN
  Filled 2020-05-14: qty 2

## 2020-05-14 MED ORDER — LIDOCAINE 2% (20 MG/ML) 5 ML SYRINGE
INTRAMUSCULAR | Status: AC
  Filled 2020-05-14: qty 5

## 2020-05-14 SURGICAL SUPPLY — 76 items
ADH SKN CLS APL DERMABOND .7 (GAUZE/BANDAGES/DRESSINGS) ×1
APL SKNCLS STERI-STRIP NONHPOA (GAUZE/BANDAGES/DRESSINGS) ×1
BAG DECANTER FOR FLEXI CONT (MISCELLANEOUS) ×3 IMPLANT
BENZOIN TINCTURE PRP APPL 2/3 (GAUZE/BANDAGES/DRESSINGS) ×3 IMPLANT
BLADE SURG 11 STRL SS (BLADE) ×3 IMPLANT
BONE VIVIGEN FORMABLE 10CC (Bone Implant) ×3 IMPLANT
BUR CUTTER 7.0 ROUND (BURR) ×3 IMPLANT
BUR MATCHSTICK NEURO 3.0 LAGG (BURR) ×3 IMPLANT
CANISTER SUCT 3000ML PPV (MISCELLANEOUS) ×3 IMPLANT
CAP LCK SPNE (Orthopedic Implant) ×10 IMPLANT
CAP LOCK SPINE RADIUS (Orthopedic Implant) IMPLANT
CAP LOCKING (Orthopedic Implant) ×30 IMPLANT
CARTRIDGE OIL MAESTRO DRILL (MISCELLANEOUS) ×1 IMPLANT
CLOSURE WOUND 1/2 X4 (GAUZE/BANDAGES/DRESSINGS) ×1
CNTNR URN SCR LID CUP LEK RST (MISCELLANEOUS) ×1 IMPLANT
CONT SPEC 4OZ STRL OR WHT (MISCELLANEOUS) ×3
COVER BACK TABLE 60X90IN (DRAPES) ×3 IMPLANT
DECANTER SPIKE VIAL GLASS SM (MISCELLANEOUS) ×3 IMPLANT
DERMABOND ADVANCED (GAUZE/BANDAGES/DRESSINGS) ×2
DERMABOND ADVANCED .7 DNX12 (GAUZE/BANDAGES/DRESSINGS) ×1 IMPLANT
DIFFUSER DRILL AIR PNEUMATIC (MISCELLANEOUS) ×3 IMPLANT
DRAPE C-ARM 42X72 X-RAY (DRAPES) ×5 IMPLANT
DRAPE LAPAROTOMY 100X72X124 (DRAPES) ×3 IMPLANT
DRAPE SURG 17X23 STRL (DRAPES) ×3 IMPLANT
DRSG OPSITE 4X5.5 SM (GAUZE/BANDAGES/DRESSINGS) ×1 IMPLANT
DRSG OPSITE POSTOP 4X10 (GAUZE/BANDAGES/DRESSINGS) ×2 IMPLANT
DRSG OPSITE POSTOP 4X6 (GAUZE/BANDAGES/DRESSINGS) ×1 IMPLANT
DURAPREP 26ML APPLICATOR (WOUND CARE) ×3 IMPLANT
ELECT REM PT RETURN 9FT ADLT (ELECTROSURGICAL) ×3
ELECTRODE REM PT RTRN 9FT ADLT (ELECTROSURGICAL) ×1 IMPLANT
EVACUATOR 3/16  PVC DRAIN (DRAIN) ×3
EVACUATOR 3/16 PVC DRAIN (DRAIN) ×1 IMPLANT
GAUZE 4X4 16PLY RFD (DISPOSABLE) IMPLANT
GAUZE SPONGE 4X4 12PLY STRL (GAUZE/BANDAGES/DRESSINGS) ×3 IMPLANT
GLOVE BIO SURGEON STRL SZ7 (GLOVE) ×4 IMPLANT
GLOVE BIO SURGEON STRL SZ8 (GLOVE) ×6 IMPLANT
GLOVE BIOGEL PI IND STRL 7.0 (GLOVE) IMPLANT
GLOVE BIOGEL PI IND STRL 7.5 (GLOVE) IMPLANT
GLOVE BIOGEL PI INDICATOR 7.0 (GLOVE) ×10
GLOVE BIOGEL PI INDICATOR 7.5 (GLOVE) ×6
GLOVE INDICATOR 8.5 STRL (GLOVE) ×6 IMPLANT
GLOVE SS N UNI LF 7.0 STRL (GLOVE) ×6 IMPLANT
GOWN STRL REUS W/ TWL LRG LVL3 (GOWN DISPOSABLE) IMPLANT
GOWN STRL REUS W/ TWL XL LVL3 (GOWN DISPOSABLE) ×2 IMPLANT
GOWN STRL REUS W/TWL 2XL LVL3 (GOWN DISPOSABLE) IMPLANT
GOWN STRL REUS W/TWL LRG LVL3 (GOWN DISPOSABLE) ×12
GOWN STRL REUS W/TWL XL LVL3 (GOWN DISPOSABLE) ×6
GRAFT BNE MATRIX VG FRMBL L 10 (Bone Implant) IMPLANT
KIT BASIN OR (CUSTOM PROCEDURE TRAY) ×3 IMPLANT
KIT INFUSE MEDIUM (Orthopedic Implant) ×2 IMPLANT
KIT POSITION SURG JACKSON T1 (MISCELLANEOUS) ×3 IMPLANT
KIT TURNOVER KIT B (KITS) ×3 IMPLANT
MILL MEDIUM DISP (BLADE) ×3 IMPLANT
NDL HYPO 21X1.5 SAFETY (NEEDLE) ×1 IMPLANT
NDL HYPO 25X1 1.5 SAFETY (NEEDLE) ×1 IMPLANT
NEEDLE HYPO 21X1.5 SAFETY (NEEDLE) ×3 IMPLANT
NEEDLE HYPO 25X1 1.5 SAFETY (NEEDLE) ×3 IMPLANT
NS IRRIG 1000ML POUR BTL (IV SOLUTION) ×3 IMPLANT
OIL CARTRIDGE MAESTRO DRILL (MISCELLANEOUS) ×3
PACK LAMINECTOMY NEURO (CUSTOM PROCEDURE TRAY) ×3 IMPLANT
ROD 140MM (Rod) ×4 IMPLANT
SCREW 6.75X35MM (Screw) ×4 IMPLANT
SCREW 6.75X40MM (Screw) ×4 IMPLANT
SPACER SUSTAIN TI 8X26X11 8D (Spacer) ×4 IMPLANT
SPACER SUSTAIN-RT 8X26X10 8D (Spacer) ×4 IMPLANT
SPONGE SURGIFOAM ABS GEL 100 (HEMOSTASIS) ×3 IMPLANT
STRIP CLOSURE SKIN 1/2X4 (GAUZE/BANDAGES/DRESSINGS) ×3 IMPLANT
SUT VIC AB 0 CT1 18XCR BRD8 (SUTURE) ×1 IMPLANT
SUT VIC AB 0 CT1 8-18 (SUTURE) ×6
SUT VIC AB 2-0 CT1 18 (SUTURE) ×5 IMPLANT
SUT VIC AB 4-0 PS2 27 (SUTURE) ×3 IMPLANT
SYR 20ML LL LF (SYRINGE) ×3 IMPLANT
TOWEL GREEN STERILE (TOWEL DISPOSABLE) ×3 IMPLANT
TOWEL GREEN STERILE FF (TOWEL DISPOSABLE) ×3 IMPLANT
TRAY FOLEY MTR SLVR 16FR STAT (SET/KITS/TRAYS/PACK) ×3 IMPLANT
WATER STERILE IRR 1000ML POUR (IV SOLUTION) ×3 IMPLANT

## 2020-05-14 NOTE — Op Note (Signed)
Preoperative diagnosis: Lumbar spinal stenosis foraminal stenosis grade 1 spondylolisthesis L4-5 L5-S1  Postoperative diagnosis: Same  Procedure: #1 decompressive lumbar laminectomies with Gill decompression with radical foraminotomies complete facetectomies of the L4, L5, and S1 nerve roots in excess and requiring more work than would be needed with a standard interbody fusion at L4-5 and L5-S1  2.  Posterior lumbar interbody fusion utilizing globus insert and rotate titanium cages packed with locally harvested autograft mixed with vivigen and BMP  3.  Segmental fixation L4-S1 with removal of hardware L2-L4 and replacement of a single long rod from L2-S1 utilizing the [employer] SDRS pedicle screw system  4.  Posterior lateral arthrodesis L4-S1 utilizing locally harvested autograft mixed with division and BMP  Surgeon: Dominica Severin Rin Gorton  Assistant: Nash Shearer  Anesthesia: General  EBL: Minimal  HPI: 69 year old gentleman has had longstanding back pain previously undergone L2-L4 fusion presented with right foot weakness that progressed to complete foot drop work-up revealed severe foraminal stenosis and collapse primarily on the right at L4-5 and L5-S1.  Due to patient's progression of clinical syndrome imaging findings and failed conservative treatment I recommended decompression stabilization procedure at L4-5 and L5-S1 as well as removal of hardware L2-L4 we extensively went over the risks and benefits of that operation with him as well as perioperative course expectations of outcome and alternatives of surgery and he understood and agreed to proceed forward.  Operative procedure: Patient was brought into the OR was due to general anesthesia positioned prone on the [place] table his back was prepped and draped in routine sterile fashion his old incision was opened it was extended inferiorly and after infiltration of 10 cc lidocaine with epi was opened up scar tissue was dissected free the hardware  was exposed and then I did identified the L4-5 lamina complex as well as S1 facet complexes and TPs at those levels.  At this point the spinous process at L4 and L5 were removed facet joints were drilled down with a high-speed drill.  I captured all the bone shavings and mucus trap.  Central decompression was begun with complete medial facetectomies then I drilled down the inferomedial aspect of the pedicles primarily at L4 but also at L5 especially on the right more extensively performed foraminotomies both the L4 and L5 nerve roots bilaterally but especially on the right were under severe amount of compression due to significant overgrowth of the facet joint on the supra reticulating facet at each level as well as significant compression due to degenerative collapse of the disc space out in the extraforaminal space so I completely performed foraminotomies unroofing the nerve during its entire tract.  Remove calcified disc remove disc from underneath the nerve and then after adequate foraminotomies been performed attention was then taken to the disc basis.  Epidural veins were coagulated the spaces were cleaned out bilaterally endplates were prepared bilaterally I then used sequential distraction and inserted cages at both level with extensive mount of autograft mixed packed centrally.  The cages opened up the disc base and also significantly reduced the pressure within the foramina.  Then under fluoroscopy of all 4 additional pedicle screws were placed I then disconnected the rod from L2-L4 at 1 point I was considering using connectors but do the alignment I felt it was better to remove the entire rod just put a long rod extension into it.  The rods were all tightened down and anchored in place.  The foramina were all reinspected to confirm patency and no migration  of graft material then Gelfoam was ON top of the dura Exparel was injected the fascia the muscle fascia approximate layers after placement of a medium  large Hemovac drain and the wound was closed in layers of Vicryl in a running 4 subcuticular Dermabond benzoin Steri-Strips and a sterile dressing was applied patient recovery in stable condition.  At the end the case all needle counts and sponge counts were correct.

## 2020-05-14 NOTE — Transfer of Care (Signed)
Immediate Anesthesia Transfer of Care Note  Patient: Jose Kline  Procedure(s) Performed: Lumbar Four-Five, Lumbar Five-Sacral One Posterior Lateral and Interbody fusion (N/A Spine Lumbar)  Patient Location: PACU  Anesthesia Type:General  Level of Consciousness: drowsy and patient cooperative  Airway & Oxygen Therapy: Patient Spontanous Breathing  Post-op Assessment: Report given to RN, Post -op Vital signs reviewed and stable and Patient moving all extremities X 4  Post vital signs: Reviewed and stable  Last Vitals:  Vitals Value Taken Time  BP 119/62 05/14/20 1448  Temp    Pulse 64 05/14/20 1451  Resp 19 05/14/20 1451  SpO2 97 % 05/14/20 1451  Vitals shown include unvalidated device data.  Last Pain:  Vitals:   05/14/20 0808  TempSrc:   PainSc: 0-No pain      Patients Stated Pain Goal: 3 (26/94/85 4627)  Complications: No complications documented.

## 2020-05-14 NOTE — Progress Notes (Signed)
Orthopedic Tech Progress Note Patient Details:  Jose Kline May 24, 1951 [id number] Called in order to [facility] for an AFO CONSULT and an [place] Patient ID: Jose Kline, male   DOB: 1950/11/02, 69 y.o.   MRN: [id number]   Janit Pagan 05/14/2020, 5:16 PM

## 2020-05-14 NOTE — H&P (Signed)
Jose Kline is an 69 y.o. male.   Chief Complaint: Back right leg pain and weakness HPI: 69 year old gentleman with progressive worsening back and right leg pain with complete foot drop on the right work-up has revealed significant degenerative collapse and compression of the right L4-L5 and S1 nerve roots.  He has previously been fused about this.  So due to his progression of clinical syndrome imaging findings and failed conservative treatment of recommended decompression stabilization procedure L4-5 L5-S1.  I extensively gone over the risks and benefits of that operation with him as well as perioperative course expectations of outcome and alternatives of surgery and he understands and agrees to proceed forward.  Past Medical History:  Diagnosis Date  . Anxiety    after wife passed away  . Complication of anesthesia    constipation  . DDD (degenerative disc disease), lumbosacral   . GERD (gastroesophageal reflux disease)   . Headache    migraines that cause him to throw up but none recently  . Hyperlipidemia   . Hypertension     Past Surgical History:  Procedure Laterality Date  . EYE SURGERY Bilateral    cataract extraction with implants  . HERNIA REPAIR     umbilical  . INGUINAL HERNIA REPAIR Right 12/29/2017   Procedure: HERNIA REPAIR INGUINAL ADULT;  Surgeon: Herbert Pun, MD;  Location: [facility] ORS;  Service: General;  Laterality: Right;  . INSERTION OF MESH Right 12/29/2017   Procedure: INSERTION OF MESH;  Surgeon: Herbert Pun, MD;  Location: [facility] ORS;  Service: General;  Laterality: Right;  . IR GENERIC HISTORICAL  12/25/2014   IR RADIOLOGIST EVAL & MGMT 12/25/2014 Greggory Keen, MD [facility] INTERV RAD  . SPINE SURGERY  04/2011   1990's was first back surgery.  2 rods in middle of back. Disc degenerative disease  . TONSILLECTOMY AND ADENOIDECTOMY     as a child    Family History  Problem Relation Age of Onset  . Cancer Mother        breast  . Kidney disease  Mother    Social History:  reports that he has been smoking cigarettes. He started smoking about 53 years ago. He has a 75.00 pack-year smoking history. He quit smokeless tobacco use about 21 years ago.  His smokeless tobacco use included snuff. He reports current alcohol use. He reports that he does not use drugs.  Allergies:  Allergies  Allergen Reactions  . Soap Rash    Can not tolerate scented soaps. Very sensitive to skin    Medications Prior to Admission  Medication Sig Dispense Refill  . Aspirin-Caffeine (BC FAST PAIN RELIEF) 845-65 MG PACK Take 1 packet by mouth daily as needed (pain.).    Marland Kitchen lisinopril-hydrochlorothiazide (ZESTORETIC) 20-12.5 MG tablet Take 1 tablet by mouth daily.    Marland Kitchen lovastatin (MEVACOR) 20 MG tablet Take 20 mg by mouth every evening.     Marland Kitchen omeprazole (PRILOSEC) 40 MG capsule Take 40 mg by mouth daily as needed (heartburn).     . sildenafil (REVATIO) 20 MG tablet Take 40 mg by mouth daily as needed (erectile dysfunction).    . triamcinolone cream (KENALOG) 0.1 % Apply 1 application topically daily as needed (rash).   99    Results for orders placed or performed during the hospital encounter of 05/14/20 (from the past 48 hour(s))  Type and screen [place]     Status: None   Collection Time: 05/14/20  8:00 AM  Result Value Ref  Range   ABO/RH(D) A POS    Antibody Screen NEG    Sample Expiration      05/17/2020,2359 Performed at [facility], [place] [address], [place], [place] [address]   Basic metabolic panel per protocol     Status: Abnormal   Collection Time: 05/14/20  8:05 AM  Result Value Ref Range   Sodium 138 135 - 145 mmol/L   Potassium 3.4 (L) 3.5 - 5.1 mmol/L   Chloride 102 98 - 111 mmol/L   CO2 27 22 - 32 mmol/L   Glucose, Bld 103 (H) 70 - 99 mg/dL    Comment: Glucose reference range applies only to samples taken after fasting for at least 8 hours.   BUN 17 8 - 23 mg/dL   Creatinine, Ser 1.00 0.61 - 1.24 mg/dL    Calcium 9.1 8.9 - 10.3 mg/dL   GFR calc non Af Amer >60 >60 mL/min   GFR calc Af Amer >60 >60 mL/min   Anion gap 9 5 - 15    Comment: Performed at [facility] [address] [place], [place] [address]  CBC per protocol     Status: None   Collection Time: 05/14/20  8:05 AM  Result Value Ref Range   WBC 6.6 4.0 - 10.5 K/uL   RBC 4.26 4.22 - 5.81 MIL/uL   Hemoglobin 13.6 13.0 - 17.0 g/dL   HCT 40.5 39 - 52 %   MCV 95.1 80.0 - 100.0 fL   MCH 31.9 26.0 - 34.0 pg   MCHC 33.6 30.0 - 36.0 g/dL   RDW 13.7 11.5 - 15.5 %   Platelets 216 150 - 400 K/uL   nRBC 0.0 0.0 - 0.2 %    Comment: Performed at [facility], [place] [address], [place], [place] [address]   No results found.  Review of Systems  Musculoskeletal: Positive for back pain.  Neurological: Positive for weakness and numbness.    Blood pressure 138/84, pulse (!) 57, temperature (!) 97.4 F (36.3 C), temperature source Oral, resp. rate 20, height 5\' 6"  (1.676 m), weight 68.9 kg, SpO2 100 %. Physical Exam HENT:     Nose: Nose normal.     Mouth/Throat:     Mouth: Mucous membranes are moist.  Eyes:     Pupils: Pupils are equal, round, and reactive to light.  Cardiovascular:     Rate and Rhythm: Normal rate.  Pulmonary:     Effort: Pulmonary effort is normal.  Abdominal:     General: Abdomen is flat.  Musculoskeletal:        General: Normal range of motion.     Cervical back: Normal range of motion.  Neurological:     Mental Status: He is alert.     Comments: Patient is awake and alert strength is 5 out of 5 iliopsoas, quads, hamstrings, gastroc, and tibialis, and EHL.  On the left on the right is get a complete foot drop with 0 out of 5 EHL and dorsiflexion      Assessment/Plan 69 year old presents for decompression stabilization procedure L4-5 L5-S1  Elaina Hoops, MD 05/14/2020, 9:16 AM

## 2020-05-14 NOTE — Anesthesia Postprocedure Evaluation (Signed)
Anesthesia Post Note  Patient: Jose Kline  Procedure(s) Performed: Lumbar Four-Five, Lumbar Five-Sacral One Posterior Lateral and Interbody fusion (N/A Spine Lumbar)     Patient location during evaluation: PACU Anesthesia Type: General Level of consciousness: awake and alert Pain management: pain level controlled Vital Signs Assessment: post-procedure vital signs reviewed and stable Respiratory status: spontaneous breathing, nonlabored ventilation, respiratory function stable and patient connected to nasal cannula oxygen Cardiovascular status: blood pressure returned to baseline and stable Postop Assessment: no apparent nausea or vomiting Anesthetic complications: no   No complications documented.  Last Vitals:  Vitals:   05/14/20 1632 05/14/20 1651  BP: 111/69 132/81  Pulse: (!) 58 61  Resp: 14 16  Temp: (!) 36.1 C 36.5 C  SpO2: 95% 100%    Last Pain:  Vitals:   05/14/20 1800  TempSrc:   PainSc: 10-Worst pain ever                 Krysti Hickling DAVID

## 2020-05-14 NOTE — Anesthesia Procedure Notes (Addendum)
Procedure Name: Intubation Date/Time: 05/14/2020 9:39 AM Performed by: Darletta Moll, CRNA Pre-anesthesia Checklist: Patient identified, Emergency Drugs available, Suction available and Patient being monitored Patient Re-evaluated:Patient Re-evaluated prior to induction Oxygen Delivery Method: Circle system utilized Preoxygenation: Pre-oxygenation with 100% oxygen Induction Type: IV induction Ventilation: Mask ventilation without difficulty Laryngoscope Size: Mac and 4 Grade View: Grade I Tube type: Oral Tube size: 7.5 mm Number of attempts: 1 Airway Equipment and Method: Stylet and Oral airway Placement Confirmation: ETT inserted through vocal cords under direct vision,  positive ETCO2 and breath sounds checked- equal and bilateral Secured at: 22 cm Tube secured with: Tape Dental Injury: Teeth and Oropharynx as per pre-operative assessment  Comments: Levert Feinstein, SRNA intubated under supervision of anesthesiologist and CRNA

## 2020-05-14 NOTE — Anesthesia Preprocedure Evaluation (Addendum)
Anesthesia Evaluation  Patient identified by MRN, date of birth, ID band Patient awake    Reviewed: Allergy & Precautions, NPO status , Patient's Chart, lab work & pertinent test results  Airway Mallampati: I  TM Distance: >3 FB Neck ROM: Full    Dental   Pulmonary Current Smoker,    Pulmonary exam normal        Cardiovascular hypertension, Pt. on medications Normal cardiovascular exam     Neuro/Psych Anxiety    GI/Hepatic GERD  Medicated and Controlled,  Endo/Other    Renal/GU      Musculoskeletal   Abdominal   Peds  Hematology   Anesthesia Other Findings   Reproductive/Obstetrics                            Anesthesia Physical Anesthesia Plan  ASA: II  Anesthesia Plan: General   Post-op Pain Management:    Induction: Intravenous  PONV Risk Score and Plan: 1 and Ondansetron  Airway Management Planned: Oral ETT  Additional Equipment:   Intra-op Plan:   Post-operative Plan: Extubation in OR  Informed Consent: I have reviewed the patients History and Physical, chart, labs and discussed the procedure including the risks, benefits and alternatives for the proposed anesthesia with the patient or authorized representative who has indicated his/her understanding and acceptance.       Plan Discussed with: CRNA and Surgeon  Anesthesia Plan Comments:         Anesthesia Quick Evaluation

## 2020-05-15 MED ORDER — TAMSULOSIN HCL 0.4 MG PO CAPS
0.4000 mg | ORAL_CAPSULE | Freq: Every day | ORAL | Status: DC
  Administered 2020-05-15 – 2020-05-16 (×2): 0.4 mg via ORAL
  Filled 2020-05-15 (×2): qty 1

## 2020-05-15 NOTE — Evaluation (Signed)
Physical Therapy Evaluation Patient Details Name: Jose Kline MRN: [id number] DOB: 14-Jul-1951 Today's Date: 05/15/2020   History of Present Illness  Pt is a 69 year old man with previous hx of back surgeries admitted for L 4-5, L5-S1 PLIF. PMH: smoker, HTN  Clinical Impression  Pt admitted with above diagnosis. At the time of PT eval, pt was able to demonstrate transfers and ambulation with gross min guard assist to supervision for safety with RW for support. Prominent foot drop on the R and antalgic gait to compensate. Pt was educated on precautions, brace application/wearing schedule, appropriate activity progression, and car transfer. Pt currently with functional limitations due to the deficits listed below (see PT Problem List). Pt will benefit from skilled PT to increase their independence and safety with mobility to allow discharge to the venue listed below.      Follow Up Recommendations Outpatient PT (When appropriate per post-op protocol - MD to order)    Equipment Recommendations  Rolling walker with 5" wheels    Recommendations for Other Services       Precautions / Restrictions Precautions Precautions: Fall;Back Precaution Booklet Issued: Yes (comment) Precaution Comments: R foot drop Required Braces or Orthoses: Spinal Brace Spinal Brace: Lumbar corset;Applied in sitting position Restrictions Weight Bearing Restrictions: No      Mobility  Bed Mobility Overal bed mobility: Needs Assistance Bed Mobility: Rolling;Sit to Sidelying Rolling: Supervision       Sit to sidelying: Supervision General bed mobility comments: In chair upon arrival and returned to supine at end of session. Use of rails required and VC's for proper log roll technique.   Transfers Overall transfer level: Needs assistance Equipment used: Rolling walker (2 wheeled);Straight cane Transfers: Sit to/from Stand Sit to Stand: Supervision         General transfer comment: cues for hand  placement on seated surface for safety. No assist required but close supervision provided for safety.   Ambulation/Gait Ambulation/Gait assistance: Min guard Gait Distance (Feet): 200 Feet Assistive device: Rolling walker (2 wheeled);Straight cane Gait Pattern/deviations: Step-through pattern;Decreased stride length;Decreased dorsiflexion - right;Decreased weight shift to right;Trunk flexed Gait velocity: Decreased Gait velocity interpretation: <1.31 ft/sec, indicative of household ambulator General Gait Details: VC's for improved posture, closer walker proximity, and forward gaze. Pt with hip hike and exaggerated swing phase of gait cycle to advance RLE forward. Uncontrolled placement of R foot on the floor. Initially with [facility] per pt request however reaching out for support with free hand as well. Appears safer with the RW.   Stairs            Wheelchair Mobility    Modified Rankin (Stroke Patients Only)       Balance Overall balance assessment: Needs assistance Sitting-balance support: Feet supported;No upper extremity supported Sitting balance-Leahy Scale: Fair     Standing balance support: No upper extremity supported;During functional activity Standing balance-Leahy Scale: Poor Standing balance comment: Reliant on UE support                             Pertinent Vitals/Pain Pain Assessment: Faces Faces Pain Scale: Hurts a little bit Pain Location: back Pain Descriptors / Indicators: Sore;Operative site guarding Pain Intervention(s): Limited activity within patient's tolerance;Monitored during session;Repositioned    Home Living Family/patient expects to be discharged to:: Private residence Living Arrangements: Alone Available Help at Discharge: Family;Available 24 hours/day (daughter and family live beside him) Type of Home: House Home Access: Stairs to  enter Entrance Stairs-Rails: Right;Left Entrance Stairs-Number of Steps: 3 Home Layout: One  level Home Equipment: Cane - single point      Prior Function Level of Independence: Independent         Comments: Pt is active, takes care of horses, does his own yardwork     Hand Dominance   Dominant Hand: Right    Extremity/Trunk Assessment   Upper Extremity Assessment Upper Extremity Assessment: Defer to OT evaluation    Lower Extremity Assessment Lower Extremity Assessment: RLE deficits/detail RLE Deficits / Details: Noted R foot drop. Pt able to wiggle toes but no active DF noted.  RLE Sensation: decreased light touch RLE Coordination: decreased fine motor;decreased gross motor    Cervical / Trunk Assessment Cervical / Trunk Assessment: Kyphotic  Communication   Communication: No difficulties  Cognition Arousal/Alertness: Awake/alert Behavior During Therapy: WFL for tasks assessed/performed Overall Cognitive Status: Within Functional Limits for tasks assessed                                        General Comments      Exercises     Assessment/Plan    PT Assessment Patient needs continued PT services  PT Problem List Decreased strength;Decreased range of motion;Decreased activity tolerance;Decreased balance;Decreased mobility;Decreased knowledge of use of DME;Decreased safety awareness;Decreased knowledge of precautions;Impaired sensation;Impaired tone;Pain       PT Treatment Interventions DME instruction;Gait training;Stair training;Functional mobility training;Therapeutic activities;Therapeutic exercise;Neuromuscular re-education;Patient/family education    PT Goals (Current goals can be found in the Care Plan section)  Acute Rehab PT Goals Patient Stated Goal: return to PLOF PT Goal Formulation: With patient Time For Goal Achievement: 05/22/20 Potential to Achieve Goals: Good    Frequency Min 5X/week   Barriers to discharge        Co-evaluation               AM-PAC PT "6 Clicks" Mobility  Outcome Measure Help  needed turning from your back to your side while in a flat bed without using bedrails?: None Help needed moving from lying on your back to sitting on the side of a flat bed without using bedrails?: A Little Help needed moving to and from a bed to a chair (including a wheelchair)?: A Little Help needed standing up from a chair using your arms (e.g., wheelchair or bedside chair)?: A Little Help needed to walk in hospital room?: A Little Help needed climbing 3-5 steps with a railing? : A Little 6 Click Score: 19    End of Session Equipment Utilized During Treatment: Gait belt Activity Tolerance: Patient tolerated treatment well Patient left: in bed;with call bell/phone within reach Nurse Communication: Mobility status PT Visit Diagnosis: Unsteadiness on feet (R26.81);Pain Pain - part of body:  (back)    Time: 9794-8016 PT Time Calculation (min) (ACUTE ONLY): 19 min   Charges:   PT Evaluation $PT Eval Low Complexity: 1 Low          Rolinda Roan, PT, DPT Acute Rehabilitation Services Pager: [phone] Office: [phone]   Thelma Comp 05/15/2020, 12:57 PM

## 2020-05-15 NOTE — Evaluation (Signed)
Occupational Therapy Evaluation Patient Details Name: Jose Kline MRN: [id number] DOB: 1951/01/01 Today's Date: 05/15/2020    History of Present Illness Pt is a 69 year old man with previous hx of back surgeries admitted for L 4-5, L5-S1 PLIF. PMH: smoker, HTN   Clinical Impression   Pt was ambulating with a cane and independent in ADL and IADL prior to admission. Pt is currently functioning up to a supervision level in ADL. Recommended tub seat. Educated at length in compensatory strategies for ADL, body mechanics and IADL to avoid. Pt has a supportive family to assist him with caring for his horses and property. Pt verbalized understanding of all information provided. No further OT needs.    Follow Up Recommendations  No OT follow up    Equipment Recommendations  Tub/shower seat (pt will purchase in the community)    Recommendations for Other Services       Precautions / Restrictions Precautions Precautions: Fall;Back Precaution Booklet Issued: Yes (comment) Required Braces or Orthoses: Spinal Brace Spinal Brace: Lumbar corset;Applied in sitting position      Mobility Bed Mobility               General bed mobility comments: pt seated at EOB upon arrival, verbally instructed in log roll technique  Transfers Overall transfer level: Needs assistance Equipment used: Rolling walker (2 wheeled) Transfers: Sit to/from Stand Sit to Stand: Supervision         General transfer comment: cues for hand placement    Balance                                           ADL either performed or assessed with clinical judgement   ADL Overall ADL's : Needs assistance/impaired Eating/Feeding: Independent   Grooming: Modified independent;Standing   Upper Body Bathing: Set up;Sitting   Lower Body Bathing: Supervison/ safety;Sit to/from stand   Upper Body Dressing : Set up;Sitting   Lower Body Dressing: Supervision/safety;Sit to/from stand   Toilet  Transfer: Supervision/safety;Ambulation;RW   Toileting- Clothing Manipulation and Hygiene: Modified independent;Sitting/lateral lean     Tub/Shower Transfer Details (indicate cue type and reason): recommended tub seat, pt will purchase on his own Functional mobility during ADLs: Supervision/safety;Rolling walker General ADL Comments: Educated pt in compensatory strategies for ADL and IADL to avoid. Pt has a supportive family to assist.     Vision Patient Visual Report: No change from baseline       Perception     Praxis      Pertinent Vitals/Pain Pain Assessment: Faces Faces Pain Scale: Hurts a little bit Pain Location: back Pain Descriptors / Indicators: Sore Pain Intervention(s): Premedicated before session;Monitored during session     Hand Dominance Right   Extremity/Trunk Assessment Upper Extremity Assessment Upper Extremity Assessment: Overall WFL for tasks assessed   Lower Extremity Assessment Lower Extremity Assessment: Defer to PT evaluation   Cervical / Trunk Assessment Cervical / Trunk Assessment: Kyphotic   Communication Communication Communication: No difficulties   Cognition Arousal/Alertness: Awake/alert Behavior During Therapy: WFL for tasks assessed/performed Overall Cognitive Status: Within Functional Limits for tasks assessed                                     General Comments       Exercises     Shoulder  Instructions      Home Living Family/patient expects to be discharged to:: Private residence Living Arrangements: Alone Available Help at Discharge: Family;Available 24 hours/day (daughter and family live beside him) Type of Home: House Home Access: Stairs to enter Technical brewer of Steps: 3 Entrance Stairs-Rails: Right;Left Home Layout: One level     Bathroom Shower/Tub: Teacher, early years/pre: Handicapped height     Home Equipment: [place] - single point          Prior  Functioning/Environment Level of Independence: Independent        Comments: Pt is active, takes care of horses, does his own yardwork        OT Problem List:        OT Treatment/Interventions:      OT Goals(Current goals can be found in the care plan section) Acute Rehab OT Goals Patient Stated Goal: return to PLOF  OT Frequency:     Barriers to D/C:            Co-evaluation              AM-PAC OT "6 Clicks" Daily Activity     Outcome Measure Help from another person eating meals?: None Help from another person taking care of personal grooming?: A Little Help from another person toileting, which includes using toliet, bedpan, or urinal?: A Little Help from another person bathing (including washing, rinsing, drying)?: A Little Help from another person to put on and taking off regular upper body clothing?: None Help from another person to put on and taking off regular lower body clothing?: A Little 6 Click Score: 20   End of Session Equipment Utilized During Treatment: Rolling walker;Back brace  Activity Tolerance: Patient tolerated treatment well Patient left: in chair;with call bell/phone within reach  OT Visit Diagnosis: Other abnormalities of gait and mobility (R26.89)                Time: 9767-3419 OT Time Calculation (min): 22 min Charges:  OT General Charges $OT Visit: 1 Visit OT Evaluation $OT Eval Low Complexity: 1 Low  Nestor Lewandowsky, OTR/L Acute Rehabilitation Services Pager: [phone] Office: [phone]  Jose Kline 05/15/2020, 9:49 AM

## 2020-05-15 NOTE — Progress Notes (Signed)
Subjective: Patient reports mild back pain,   Objective: Vital signs in last 24 hours: Temp:  [97 F (36.1 C)-98.5 F (36.9 C)] 98.3 F (36.8 C) (07/22 0748) Pulse Rate:  [51-65] 59 (07/22 0748) Resp:  [14-24] 18 (07/22 0748) BP: (104-132)/(59-81) 109/66 (07/22 0748) SpO2:  [92 %-100 %] 96 % (07/22 0748)  Intake/Output from previous day: 07/21 0701 - 07/22 0700 In: 2585 [P.O.:75; I.V.:2050; IV Piggyback:460] Out: 1950 [Urine:1250; Drains:450; Blood:250] Intake/Output this shift: No intake/output data recorded.  Neurologic: Grossly normal  Lab Results: Lab Results  Component Value Date   WBC 6.6 05/14/2020   HGB 13.6 05/14/2020   HCT 40.5 05/14/2020   MCV 95.1 05/14/2020   PLT 216 05/14/2020   No results found for: INR, PROTIME BMET Lab Results  Component Value Date   NA 138 05/14/2020   K 3.4 (L) 05/14/2020   CL 102 05/14/2020   CO2 27 05/14/2020   GLUCOSE 103 (H) 05/14/2020   BUN 17 05/14/2020   CREATININE 1.00 05/14/2020   CALCIUM 9.1 05/14/2020    Studies/Results: DG Lumbar Spine 2-3 Views  Result Date: 05/14/2020 CLINICAL DATA:  Elective surgery. L4-5-S1 posterolateral interbody fusion. EXAM: LUMBAR SPINE - 2-3 VIEW; DG C-ARM 1-60 MIN COMPARISON:  04/05/2019 FINDINGS: Remote posterior fusion of L2-L4. Interval placement of transpedicular screws at L5 and S1 with interbody fusion devices identified at L4-5 and L5-S1. IMPRESSION: Intraoperative images during posterior fusion at L4-S1. Electronically Signed   By: Nolon Nations M.D.   On: 05/14/2020 15:30   DG C-Arm 1-60 Min  Result Date: 05/14/2020 CLINICAL DATA:  Elective surgery. L4-5-S1 posterolateral interbody fusion. EXAM: LUMBAR SPINE - 2-3 VIEW; DG C-ARM 1-60 MIN COMPARISON:  04/05/2019 FINDINGS: Remote posterior fusion of L2-L4. Interval placement of transpedicular screws at L5 and S1 with interbody fusion devices identified at L4-5 and L5-S1. IMPRESSION: Intraoperative images during posterior fusion  at L4-S1. Electronically Signed   By: Nolon Nations M.D.   On: 05/14/2020 15:30    Assessment/Plan: 69 year old male postop day 1 lumbar fusion. Doing ok, drain still has some high output. We will see how it does throughout the day and possibly home this afternoon after working with therapy.   LOS: 1 day    Ocie Cornfield [facility] 05/15/2020, 7:51 AM

## 2020-05-16 MED ORDER — OXYCODONE-ACETAMINOPHEN 5-325 MG PO TABS: 1.0000 | ORAL_TABLET | ORAL | 0 refills | Status: DC | PRN

## 2020-05-16 MED ORDER — METHOCARBAMOL 500 MG PO TABS: 500.0000 mg | ORAL_TABLET | Freq: Four times a day (QID) | ORAL | 0 refills | Status: DC

## 2020-05-16 NOTE — TOC Transition Note (Signed)
Transition of Care [facility]) - CM/SW Discharge Note   Patient Details  Name: Jose Kline MRN: [id number] Date of Birth: 1951/07/06  Transition of Care [facility] CM/SW Contact:  Angelita Ingles, RN Phone Number: [phone]  05/16/2020, 2:13 PM   Clinical Narrative:    Home health has been set up with [facility]. Agency will contact patient for start of service information. No further discharge needs noted CM will sign off.     Barriers to Discharge: No Barriers Identified   Patient Goals and CMS Choice Patient states their goals for this hospitalization and ongoing recovery are:: Wants to go home CMS Medicare.gov Compare Post Acute Care list provided to:: Patient Choice offered to / list presented to : Patient  Discharge Placement                       Discharge Plan and Services                DME Arranged: N/A DME Agency: NA       HH Arranged: PT HH Agency: [facility] Date [place]: 05/16/20 Time [place]: 9021 Representative spoke with at [place]: [place] ([place]) Interventions     Readmission Risk Interventions No flowsheet data found.

## 2020-05-16 NOTE — Plan of Care (Signed)
Patient alert and oriented, mae's well, voiding adequate amount of urine, swallowing without difficulty, no c/o pain at time of discharge. PT was concerned about patient's safety d/t inability to climb stairs safely but patient insisted his grandson will assit him at home and will make sure he's safe and Patient stated he feels comfortable going home at this time. Patient discharged home with family. Script and discharged instructions given to patient. Patient and family stated understanding of instructions given. Patient has an appointment with Dr. Saintclair Halsted

## 2020-05-16 NOTE — Progress Notes (Signed)
Physical Therapy Treatment Patient Details Name: Jose Kline MRN: [id number] DOB: 11-26-1950 Today's Date: 05/16/2020    History of Present Illness Pt is a 69 year old man with previous hx of back surgeries admitted for L 4-5, L5-S1 PLIF. PMH: smoker, HTN    PT Comments    Pt with increased difficulty with gait training this afternoon. Noted increased instances of knee buckling (bilaterally, however L worse than the R). We were unable to initiate stair training as he was not able to transition away from the RW to the railing for attempt. Discussed safety awareness with pt and grandson; encouraged one more night in the hospital and an additional therapy session prior to d/c to maximize independence and safety prior to return home. Pt declined this, stating he just wants to go home and feels he will do better when he returns home. Grandson present and reports he is able to stay with the patient tonight and assist him into the house. Reinforced education on stairs (verbal education on sequencing and how grandson should guard/assist pt), car transfer, and use of AFO/lumbar corset. Will continue to follow.    Follow Up Recommendations  Home health PT;Supervision for mobility/OOB     Equipment Recommendations  Rolling walker with 5" wheels    Recommendations for Other Services       Precautions / Restrictions Precautions Precautions: Fall;Back Precaution Booklet Issued: Yes (comment) Precaution Comments: R foot drop Required Braces or Orthoses: Spinal Brace;Other Brace Spinal Brace: Lumbar corset;Applied in sitting position Other Brace: AFO Restrictions Weight Bearing Restrictions: No    Mobility  Bed Mobility Overal bed mobility: Needs Assistance Bed Mobility: Rolling;Sidelying to Sit Rolling: Modified independent (Device/Increase time) Sidelying to sit: Supervision     Sit to sidelying: Supervision General bed mobility comments: Increased time. Appears fatigued. Railings  utilized.   Transfers Overall transfer level: Needs assistance Equipment used: Rolling walker (2 wheeled) Transfers: Sit to/from Stand Sit to Stand: Min guard         General transfer comment: cues for hand placement on seated surface for safety. No assist required but close guard provided for safety.   Ambulation/Gait Ambulation/Gait assistance: Mod assist Gait Distance (Feet): 50 Feet Assistive device: Rolling walker (2 wheeled) Gait Pattern/deviations: Step-through pattern;Decreased stride length;Decreased dorsiflexion - right;Decreased weight shift to right;Trunk flexed Gait velocity: Decreased Gait velocity interpretation: <1.31 ft/sec, indicative of household ambulator General Gait Details: The goal was to ambulate to the stairs and back to the room after stair training. Pt with increased LE buckling (L worse than R), and required mod assist throughout gait training. When we got to the stairs, pt unable to attempt due to continued buckling and inability to transition away from the walker to the railing. He was able to make it back to the bed without incident, however heavy mod assist was provided at the end.    Stairs Stairs:  (Unable to attempt)           Wheelchair Mobility    Modified Rankin (Stroke Patients Only)       Balance Overall balance assessment: Needs assistance Sitting-balance support: Feet supported;No upper extremity supported Sitting balance-Leahy Scale: Fair     Standing balance support: No upper extremity supported;During functional activity Standing balance-Leahy Scale: Poor Standing balance comment: Reliant on UE support                            Cognition Arousal/Alertness: Awake/alert Behavior During Therapy: [facility]  for tasks assessed/performed Overall Cognitive Status: Within Functional Limits for tasks assessed                                        Exercises      General Comments        Pertinent  Vitals/Pain Pain Assessment: Faces Faces Pain Scale: Hurts a little bit Pain Location: back Pain Descriptors / Indicators: Sore;Operative site guarding Pain Intervention(s): Limited activity within patient's tolerance;Monitored during session;Repositioned    Home Living                      Prior Function            PT Goals (current goals can now be found in the care plan section) Acute Rehab PT Goals Patient Stated Goal: return to PLOF PT Goal Formulation: With patient Time For Goal Achievement: 05/22/20 Potential to Achieve Goals: Good Progress towards PT goals: Progressing toward goals    Frequency    Min 5X/week      PT Plan Current plan remains appropriate    Co-evaluation              AM-PAC PT "6 Clicks" Mobility   Outcome Measure  Help needed turning from your back to your side while in a flat bed without using bedrails?: None Help needed moving from lying on your back to sitting on the side of a flat bed without using bedrails?: A Little Help needed moving to and from a bed to a chair (including a wheelchair)?: A Little Help needed standing up from a chair using your arms (e.g., wheelchair or bedside chair)?: A Little Help needed to walk in hospital room?: A Little Help needed climbing 3-5 steps with a railing? : A Little 6 Click Score: 19    End of Session Equipment Utilized During Treatment: Gait belt Activity Tolerance: Patient tolerated treatment well Patient left: in bed;with call bell/phone within reach Nurse Communication: Mobility status PT Visit Diagnosis: Unsteadiness on feet (R26.81);Pain Pain - part of body:  (back)     Time: 0569-7948 PT Time Calculation (min) (ACUTE ONLY): 44 min  Charges:  $Gait Training: 23-37 mins $Self Care/Home Management: 8-22                     Jose Kline, PT, DPT Acute Rehabilitation Services Pager: [phone] Office: [phone]    Thelma Comp 05/16/2020, 2:53 PM

## 2020-05-16 NOTE — Progress Notes (Signed)
Physical Therapy Treatment Patient Details Name: Jose Kline MRN: [id number] DOB: 1950-10-30 Today's Date: 05/16/2020    History of Present Illness Pt is a 69 year old man with previous hx of back surgeries admitted for L 4-5, L5-S1 PLIF. PMH: smoker, HTN    PT Comments    Pt progressing towards physical therapy goals. Was able to ambulate with AFO donned, and demonstrated improved gait pattern. Noted pt with 3 L knee buckles which required up to mod assist to recover. Orthotist to come back with AFO after adjustments. Will attempt stair training with AFO donned as able this afternoon.    Follow Up Recommendations  Home health PT;Supervision for mobility/OOB     Equipment Recommendations  Rolling walker with 5" wheels    Recommendations for Other Services       Precautions / Restrictions Precautions Precautions: Fall;Back Precaution Booklet Issued: Yes (comment) Precaution Comments: R foot drop Required Braces or Orthoses: Spinal Brace Spinal Brace: Lumbar corset;Applied in sitting position Restrictions Weight Bearing Restrictions: No    Mobility  Bed Mobility Overal bed mobility: Needs Assistance Bed Mobility: Rolling;Sit to Sidelying;Sidelying to Sit Rolling: Modified independent (Device/Increase time) Sidelying to sit: Supervision     Sit to sidelying: Supervision General bed mobility comments: HOB flat and rails lowered to simulate home environment. No assist required however increased time taken to complete transition to/from EOB.   Transfers Overall transfer level: Needs assistance Equipment used: Rolling walker (2 wheeled);Straight cane Transfers: Sit to/from Stand Sit to Stand: Min guard         General transfer comment: cues for hand placement on seated surface for safety. No assist required but close guard provided for safety.   Ambulation/Gait Ambulation/Gait assistance: Min guard;Mod assist Gait Distance (Feet): 200 Feet Assistive device:  Rolling walker (2 wheeled);Straight cane Gait Pattern/deviations: Step-through pattern;Decreased stride length;Decreased dorsiflexion - right;Decreased weight shift to right;Trunk flexed Gait velocity: Decreased Gait velocity interpretation: <1.31 ft/sec, indicative of household ambulator General Gait Details: VC's for improved posture, closer walker proximity, and forward gaze. Ambulated with AFO donned and noted a smoother gait pattern. With distance, pt had 3 episodes of L knee buckle requiring up to mod assist to recover.    Stairs             Wheelchair Mobility    Modified Rankin (Stroke Patients Only)       Balance Overall balance assessment: Needs assistance Sitting-balance support: Feet supported;No upper extremity supported Sitting balance-Leahy Scale: Fair     Standing balance support: No upper extremity supported;During functional activity Standing balance-Leahy Scale: Poor Standing balance comment: Reliant on UE support                            Cognition Arousal/Alertness: Awake/alert Behavior During Therapy: WFL for tasks assessed/performed Overall Cognitive Status: Within Functional Limits for tasks assessed                                        Exercises      General Comments        Pertinent Vitals/Pain Pain Assessment: Faces Faces Pain Scale: Hurts a little bit Pain Location: back Pain Descriptors / Indicators: Sore;Operative site guarding    Home Living                      Prior Function  PT Goals (current goals can now be found in the care plan section) Acute Rehab PT Goals Patient Stated Goal: return to PLOF PT Goal Formulation: With patient Time For Goal Achievement: 05/22/20 Potential to Achieve Goals: Good Progress towards PT goals: Progressing toward goals    Frequency    Min 5X/week      PT Plan Current plan remains appropriate    Co-evaluation               AM-PAC PT "6 Clicks" Mobility   Outcome Measure  Help needed turning from your back to your side while in a flat bed without using bedrails?: None Help needed moving from lying on your back to sitting on the side of a flat bed without using bedrails?: A Little Help needed moving to and from a bed to a chair (including a wheelchair)?: A Little Help needed standing up from a chair using your arms (e.g., wheelchair or bedside chair)?: A Little Help needed to walk in hospital room?: A Little Help needed climbing 3-5 steps with a railing? : A Little 6 Click Score: 19    End of Session Equipment Utilized During Treatment: Gait belt Activity Tolerance: Patient tolerated treatment well Patient left: in bed;with call bell/phone within reach Nurse Communication: Mobility status PT Visit Diagnosis: Unsteadiness on feet (R26.81);Pain Pain - part of body:  (back)     Time: 3614-4315 PT Time Calculation (min) (ACUTE ONLY): 33 min  Charges:  $Gait Training: 23-37 mins                     Rolinda Roan, PT, DPT Acute Rehabilitation Services Pager: [phone] Office: [phone]    Thelma Comp 05/16/2020, 1:04 PM

## 2020-05-16 NOTE — Discharge Summary (Signed)
Physician Discharge Summary  Patient ID: Jose Kline MRN: [id number] DOB/AGE: 68-Jul-1952 68 y.o.  Admit date: 05/14/2020 Discharge date: 05/16/2020  Admission Diagnoses: Lumbar spinal stenosis foraminal stenosis grade 1 spondylolisthesis L4-5 L5-S1    Discharge Diagnoses: same   Discharged Condition: good  Hospital Course: The patient was admitted on 05/14/2020 and taken to the operating room where the patient underwent PLIF L4-S1. The patient tolerated the procedure well and was taken to the recovery room and then to the floor in stable condition. The hospital course was routine. There were no complications. The wound remained clean dry and intact. Pt had appropriate back soreness. No complaints of leg pain or new N/T/W. The patient remained afebrile with stable vital signs, and tolerated a regular diet. The patient continued to increase activities, and pain was well controlled with oral pain medications.   Consults: None  Significant Diagnostic Studies:  Results for orders placed or performed during the hospital encounter of 57/84/69  Basic metabolic panel per protocol  Result Value Ref Range   Sodium 138 135 - 145 mmol/L   Potassium 3.4 (L) 3.5 - 5.1 mmol/L   Chloride 102 98 - 111 mmol/L   CO2 27 22 - 32 mmol/L   Glucose, Bld 103 (H) 70 - 99 mg/dL   BUN 17 8 - 23 mg/dL   Creatinine, Ser 1.00 0.61 - 1.24 mg/dL   Calcium 9.1 8.9 - 10.3 mg/dL   GFR calc non Af Amer >60 >60 mL/min   GFR calc Af Amer >60 >60 mL/min   Anion gap 9 5 - 15  CBC per protocol  Result Value Ref Range   WBC 6.6 4.0 - 10.5 K/uL   RBC 4.26 4.22 - 5.81 MIL/uL   Hemoglobin 13.6 13.0 - 17.0 g/dL   HCT 40.5 39 - 52 %   MCV 95.1 80.0 - 100.0 fL   MCH 31.9 26.0 - 34.0 pg   MCHC 33.6 30.0 - 36.0 g/dL   RDW 13.7 11.5 - 15.5 %   Platelets 216 150 - 400 K/uL   nRBC 0.0 0.0 - 0.2 %  Type and screen [place]  Result Value Ref Range   ABO/RH(D) A POS    Antibody Screen NEG    Sample  Expiration      05/17/2020,2359 Performed at [facility], [address] [place], [place] [address]     DG Lumbar Spine 2-3 Views  Result Date: 05/14/2020 CLINICAL DATA:  Elective surgery. L4-5-S1 posterolateral interbody fusion. EXAM: LUMBAR SPINE - 2-3 VIEW; DG C-ARM 1-60 MIN COMPARISON:  04/05/2019 FINDINGS: Remote posterior fusion of L2-L4. Interval placement of transpedicular screws at L5 and S1 with interbody fusion devices identified at L4-5 and L5-S1. IMPRESSION: Intraoperative images during posterior fusion at L4-S1. Electronically Signed   By: Nolon Nations M.D.   On: 05/14/2020 15:30   DG C-Arm 1-60 Min  Result Date: 05/14/2020 CLINICAL DATA:  Elective surgery. L4-5-S1 posterolateral interbody fusion. EXAM: LUMBAR SPINE - 2-3 VIEW; DG C-ARM 1-60 MIN COMPARISON:  04/05/2019 FINDINGS: Remote posterior fusion of L2-L4. Interval placement of transpedicular screws at L5 and S1 with interbody fusion devices identified at L4-5 and L5-S1. IMPRESSION: Intraoperative images during posterior fusion at L4-S1. Electronically Signed   By: Nolon Nations M.D.   On: 05/14/2020 15:30    Antibiotics:  Anti-infectives (From admission, onward)   Start     Dose/Rate Route Frequency Ordered Stop   05/14/20 2130  ceFAZolin (ANCEF) IVPB 2g/100 mL  premix     Discontinue     2 g 200 mL/hr over 30 Minutes Intravenous Every 8 hours 05/14/20 1653 05/16/20 2129   05/14/20 1102  bacitracin 50,000 Units in sodium chloride 0.9 % 500 mL irrigation  Status:  Discontinued          As needed 05/14/20 1102 05/14/20 1444   05/14/20 0930  ceFAZolin (ANCEF) IVPB 2g/100 mL premix        2 g 200 mL/hr over 30 Minutes Intravenous On call to O.R. 05/14/20 0917 05/14/20 1355   05/14/20 0919  ceFAZolin (ANCEF) 2-4 GM/100ML-% IVPB       Note to Pharmacy: Laurita Quint   : cabinet override      05/14/20 0919 05/14/20 2129      Discharge Exam: Blood pressure 104/66, pulse 78, temperature 98.4 F (36.9 C),  temperature source Oral, resp. rate 18, height 5\' 6"  (1.676 m), weight 68.9 kg, SpO2 96 %. Neurologic: Grossly normal Ambulating and voiding well, incision cdi  Discharge Medications:   Allergies as of 05/16/2020      Reactions   Soap Rash   Can not tolerate scented soaps. Very sensitive to skin      Medication List    TAKE these medications   BC Fast Pain Relief 845-65 MG Pack Generic drug: Aspirin-Caffeine Take 1 packet by mouth daily as needed (pain.).   lisinopril-hydrochlorothiazide 20-12.5 MG tablet Commonly known as: ZESTORETIC Take 1 tablet by mouth daily.   lovastatin 20 MG tablet Commonly known as: MEVACOR Take 20 mg by mouth every evening.   methocarbamol 500 MG tablet Commonly known as: Robaxin Take 1 tablet (500 mg total) by mouth 4 (four) times daily.   omeprazole 40 MG capsule Commonly known as: PRILOSEC Take 40 mg by mouth daily as needed (heartburn).   oxyCODONE-acetaminophen 5-325 MG tablet Commonly known as: Percocet Take 1 tablet by mouth every 4 (four) hours as needed for severe pain.   sildenafil 20 MG tablet Commonly known as: REVATIO Take 40 mg by mouth daily as needed (erectile dysfunction).   triamcinolone cream 0.1 % Commonly known as: KENALOG Apply 1 application topically daily as needed (rash).            Durable Medical Equipment  (From admission, onward)         Start     Ordered   05/16/20 1043  DME Walker  Once       Question Answer Comment  Walker: With [place]   Patient needs a walker to treat with the following condition Fusion of spine of lumbar region      05/16/20 1044          Disposition: home   Final Dx: PLIF L4-S1  Discharge Instructions    Call MD for:  difficulty breathing, headache or visual disturbances   Complete by: As directed    Call MD for:  hives   Complete by: As directed    Call MD for:  persistant dizziness or light-headedness   Complete by: As directed    Call MD for:   persistant nausea and vomiting   Complete by: As directed    Call MD for:  redness, tenderness, or signs of infection (pain, swelling, redness, odor or green/yellow discharge around incision site)   Complete by: As directed    Call MD for:  severe uncontrolled pain   Complete by: As directed    Call MD for:  temperature >100.4   Complete by:  As directed    Diet - low sodium heart healthy   Complete by: As directed    Driving Restrictions   Complete by: As directed    No driving for 2 weeks, no riding in the car for 1 week   Increase activity slowly   Complete by: As directed    Lifting restrictions   Complete by: As directed    No lifting more than 8 lbs   Remove dressing in 24 hours   Complete by: As directed          Signed: Ocie Cornfield Aria Jarrard 05/16/2020, 10:45 AM

## 2020-05-16 NOTE — Discharge Instructions (Signed)

## 2020-05-20 MED FILL — Sodium Chloride IV Soln 0.9%: INTRAVENOUS | Qty: 1000 | Status: AC

## 2020-05-20 MED FILL — Heparin Sodium (Porcine) Inj 1000 Unit/ML: INTRAMUSCULAR | Qty: 30 | Status: AC

## 2020-07-17 ENCOUNTER — Other Ambulatory Visit: Payer: Self-pay | Admitting: Neurosurgery

## 2020-08-01 NOTE — Pre-Procedure Instructions (Addendum)
Jose Kline  08/01/2020    Your procedure is scheduled on Wednesday, August 06, 2020 at 1:30 PM.   Report to [facility] Entrance "A" Admitting Office at 11:30 AM.   Call this number if you have problems the morning of surgery: [phone]   Questions prior to day of surgery, please call [phone] between 8 & 4 PM.   Remember:  Do not eat or drink after midnight Tuesday, 08/05/20.  Take these medicines the morning of surgery with A SIP OF WATER: Omeprazole (Prilosec) - if needed, Hydrocodone (Vicodin) - if needed  Stop BC Fast Pain relief as of today prior to surgery. Do not use any other Aspirin containing products, NSAIDS (Ibuprofen, Aleve, etc), Multivitamins, Fish Oil or Herbal medications prior to surgery.  Do not smoke 24 hours prior to surgery.    Do not wear jewelry.  Do not wear lotions, powders, cologne or deodorant.  Men may shave face and neck.  Do not bring valuables to the hospital.  [facility] is not responsible for any belongings or valuables.  Contacts, dentures or bridgework may not be worn into surgery.  Leave your suitcase in the car.  After surgery it may be brought to your room.  For patients admitted to the hospital, discharge time will be determined by your treatment team.  [facility] - Preparing for Surgery  Before surgery, you can play an important role.  Because skin is not sterile, your skin needs to be as free of germs as possible.  You can reduce the number of germs on you skin by washing with CHG (chlorahexidine gluconate) soap before surgery.  CHG is an antiseptic cleaner which kills germs and bonds with the skin to continue killing germs even after washing.  Oral Hygiene is also important in reducing the risk of infection.  Remember to brush your teeth with your regular toothpaste the morning of surgery.  Please DO NOT use if you have an allergy to CHG or antibacterial soaps.  If your skin becomes reddened/irritated stop using the  CHG and inform your nurse when you arrive at Short Stay.  Do not shave (including legs and underarms) for at least 48 hours prior to the first CHG shower.  You may shave your face.  Please follow these instructions carefully:   1.  Shower with CHG Soap the night before surgery and the morning of Surgery.  2.  If you choose to wash your hair, wash your hair first as usual with your normal shampoo.  3.  After you shampoo, rinse your hair and body thoroughly to remove the shampoo. 4.  Use CHG as you would any other liquid soap.  You can apply chg directly to the skin and wash gently with a      scrungie or washcloth.           5.  Apply the CHG Soap to your body ONLY FROM THE NECK DOWN.   Do not use on open wounds or open sores. Avoid contact with your eyes, ears, mouth and genitals (private parts).  Wash genitals (private parts) with your normal soap - do this prior to using the CHG soap.  6.  Wash thoroughly, paying special attention to the area where your surgery will be performed.  7.  Thoroughly rinse your body with warm water from the neck down.  8.  DO NOT shower/wash with your normal soap after using and rinsing off the CHG Soap.  9.  Pat yourself dry  with a clean towel.            10.  Wear clean pajamas.            11.  Place clean sheets on your bed the night of your first shower and do not sleep with pets.  Day of Surgery  Shower as above.  Do not apply any lotions/deodorants the morning of surgery.   Please wear clean clothes to the hospital. Remember to brush your teeth with toothpaste.  Please read over the fact sheets that you were given.

## 2020-08-04 ENCOUNTER — Encounter (HOSPITAL_COMMUNITY)
Admission: RE | Admit: 2020-08-04 | Discharge: 2020-08-04 | Disposition: A | Discharge status: Home / Self Care | Payer: Medicare Other | Source: Ambulatory Visit | Attending: Neurosurgery | Admitting: Neurosurgery

## 2020-08-04 ENCOUNTER — Other Ambulatory Visit (HOSPITAL_COMMUNITY)
Admission: RE | Admit: 2020-08-04 | Discharge: 2020-08-04 | Disposition: A | Discharge status: Home / Self Care | Payer: Medicare Other | Source: Ambulatory Visit | Attending: Neurosurgery | Admitting: Neurosurgery

## 2020-08-04 ENCOUNTER — Other Ambulatory Visit: Payer: Self-pay

## 2020-08-04 ENCOUNTER — Encounter (HOSPITAL_COMMUNITY): Payer: Self-pay

## 2020-08-04 DIAGNOSIS — Z20822 Contact with and (suspected) exposure to covid-19: Secondary | ICD-10-CM | POA: Insufficient documentation

## 2020-08-04 DIAGNOSIS — Z01818 Encounter for other preprocedural examination: Secondary | ICD-10-CM | POA: Diagnosis not present

## 2020-08-04 DIAGNOSIS — Z01812 Encounter for preprocedural laboratory examination: Secondary | ICD-10-CM | POA: Insufficient documentation

## 2020-08-04 DIAGNOSIS — I10 Essential (primary) hypertension: Secondary | ICD-10-CM | POA: Insufficient documentation

## 2020-08-04 LAB — CBC
HCT: 37.8 % — ABNORMAL LOW (ref 39.0–52.0)
Hemoglobin: 12.6 g/dL — ABNORMAL LOW (ref 13.0–17.0)
MCH: 31.2 pg (ref 26.0–34.0)
MCHC: 33.3 g/dL (ref 30.0–36.0)
MCV: 93.6 fL (ref 80.0–100.0)
Platelets: 280 10*3/uL (ref 150–400)
RBC: 4.04 MIL/uL — ABNORMAL LOW (ref 4.22–5.81)
RDW: 14.6 % (ref 11.5–15.5)
WBC: 8.9 10*3/uL (ref 4.0–10.5)
nRBC: 0 % (ref 0.0–0.2)

## 2020-08-04 LAB — BASIC METABOLIC PANEL
Anion gap: 11 (ref 5–15)
BUN: 15 mg/dL (ref 8–23)
CO2: 24 mmol/L (ref 22–32)
Calcium: 9.1 mg/dL (ref 8.9–10.3)
Chloride: 99 mmol/L (ref 98–111)
Creatinine, Ser: 0.95 mg/dL (ref 0.61–1.24)
GFR, Estimated: >60 mL/min (ref >60)
Glucose, Bld: 149 mg/dL — ABNORMAL HIGH (ref 70–99)
Potassium: 3.1 mmol/L — ABNORMAL LOW (ref 3.5–5.1)
Sodium: 134 mmol/L — ABNORMAL LOW (ref 135–145)

## 2020-08-04 LAB — SARS CORONAVIRUS 2 (TAT 6-24 HRS): SARS Coronavirus 2: NEGATIVE

## 2020-08-04 LAB — SURGICAL PCR SCREEN
MRSA, PCR: NEGATIVE
Staphylococcus aureus: NEGATIVE

## 2020-08-04 NOTE — Progress Notes (Signed)
PCP - Dr. Harrel Lemon  Chest x-ray - 11/27/19 EKG - today  COVID TEST- scheduled for today   Anesthesia review: No  Patient denies shortness of breath, fever, cough and chest pain at PAT appointment   All instructions explained to the patient, with a verbal understanding of the material. Patient agrees to go over the instructions while at home for a better understanding. Patient also instructed to self quarantine after being tested for COVID-19. The opportunity to ask questions was provided.

## 2020-08-06 ENCOUNTER — Inpatient Hospital Stay (HOSPITAL_COMMUNITY): Payer: Medicare Other

## 2020-08-06 ENCOUNTER — Encounter (HOSPITAL_COMMUNITY)
Admission: RE | Disposition: A | Discharge status: Home / Self Care | Payer: Self-pay | Source: Ambulatory Visit | Attending: Neurosurgery

## 2020-08-06 ENCOUNTER — Inpatient Hospital Stay (HOSPITAL_COMMUNITY): Payer: Medicare Other | Admitting: Certified Registered Nurse Anesthetist

## 2020-08-06 ENCOUNTER — Observation Stay (HOSPITAL_COMMUNITY)
Admission: RE | Admit: 2020-08-06 | Discharge: 2020-08-07 | Disposition: A | Discharge status: Home / Self Care | Payer: Medicare Other | Source: Ambulatory Visit | Attending: Neurosurgery | Admitting: Neurosurgery

## 2020-08-06 ENCOUNTER — Other Ambulatory Visit: Payer: Self-pay

## 2020-08-06 ENCOUNTER — Encounter (HOSPITAL_COMMUNITY): Payer: Self-pay | Admitting: Neurosurgery

## 2020-08-06 DIAGNOSIS — Z7982 Long term (current) use of aspirin: Secondary | ICD-10-CM | POA: Diagnosis not present

## 2020-08-06 DIAGNOSIS — F17200 Nicotine dependence, unspecified, uncomplicated: Secondary | ICD-10-CM | POA: Diagnosis not present

## 2020-08-06 DIAGNOSIS — Z79899 Other long term (current) drug therapy: Secondary | ICD-10-CM | POA: Diagnosis not present

## 2020-08-06 DIAGNOSIS — M4804 Spinal stenosis, thoracic region: Secondary | ICD-10-CM | POA: Diagnosis not present

## 2020-08-06 DIAGNOSIS — I10 Essential (primary) hypertension: Secondary | ICD-10-CM | POA: Diagnosis not present

## 2020-08-06 DIAGNOSIS — M5104 Intervertebral disc disorders with myelopathy, thoracic region: Secondary | ICD-10-CM | POA: Diagnosis not present

## 2020-08-06 DIAGNOSIS — Z419 Encounter for procedure for purposes other than remedying health state, unspecified: Secondary | ICD-10-CM

## 2020-08-06 HISTORY — PX: LUMBAR LAMINECTOMY/DECOMPRESSION MICRODISCECTOMY: SHX5026

## 2020-08-06 HISTORY — PX: THORACIC DISCECTOMY: SHX6113

## 2020-08-06 HISTORY — PX: DECOMPRESSIVE LUMBAR LAMINECTOMY LEVEL 3: SHX5793

## 2020-08-06 SURGERY — THORACIC DISCECTOMY
Anesthesia: General | Site: Back

## 2020-08-06 MED ORDER — LACTATED RINGERS IV SOLN: INTRAVENOUS | Status: DC

## 2020-08-06 MED ORDER — THROMBIN 20000 UNITS EX SOLR: CUTANEOUS | Status: DC | PRN

## 2020-08-06 MED ORDER — ROCURONIUM BROMIDE 10 MG/ML (PF) SYRINGE
PREFILLED_SYRINGE | INTRAVENOUS | Status: DC | PRN
  Administered 2020-08-06: 80 mg via INTRAVENOUS
  Administered 2020-08-06: 20 mg via INTRAVENOUS

## 2020-08-06 MED ORDER — HYDROCHLOROTHIAZIDE 25 MG PO TABS
25.0000 mg | ORAL_TABLET | Freq: Every day | ORAL | Status: DC
  Filled 2020-08-06: qty 1

## 2020-08-06 MED ORDER — PANTOPRAZOLE SODIUM 40 MG PO TBEC
40.0000 mg | DELAYED_RELEASE_TABLET | Freq: Every day | ORAL | Status: DC
  Administered 2020-08-06 – 2020-08-07 (×2): 40 mg via ORAL
  Filled 2020-08-06 (×2): qty 1

## 2020-08-06 MED ORDER — LISINOPRIL 20 MG PO TABS
20.0000 mg | ORAL_TABLET | Freq: Every day | ORAL | Status: DC
  Filled 2020-08-06: qty 1

## 2020-08-06 MED ORDER — CEFAZOLIN SODIUM-DEXTROSE 2-4 GM/100ML-% IV SOLN
2.0000 g | Freq: Three times a day (TID) | INTRAVENOUS | Status: DC
  Administered 2020-08-07 (×2): 2 g via INTRAVENOUS
  Filled 2020-08-06 (×2): qty 100

## 2020-08-06 MED ORDER — CEFAZOLIN SODIUM 1 G IJ SOLR
INTRAMUSCULAR | Status: AC
  Filled 2020-08-06: qty 20

## 2020-08-06 MED ORDER — MENTHOL 3 MG MT LOZG: 1.0000 | LOZENGE | OROMUCOSAL | Status: DC | PRN

## 2020-08-06 MED ORDER — PROPOFOL 10 MG/ML IV BOLUS
INTRAVENOUS | Status: DC | PRN
  Administered 2020-08-06: 120 mg via INTRAVENOUS

## 2020-08-06 MED ORDER — OXYCODONE HCL 5 MG PO TABS
10.0000 mg | ORAL_TABLET | ORAL | Status: DC | PRN
  Administered 2020-08-07 (×3): 10 mg via ORAL
  Filled 2020-08-06 (×3): qty 2

## 2020-08-06 MED ORDER — BUPIVACAINE HCL (PF) 0.25 % IJ SOLN
INTRAMUSCULAR | Status: AC
  Filled 2020-08-06: qty 30

## 2020-08-06 MED ORDER — ASPIRIN-CAFFEINE 845-65 MG PO PACK: 1.0000 | PACK | Freq: Every day | ORAL | Status: DC | PRN

## 2020-08-06 MED ORDER — THROMBIN 20000 UNITS EX SOLR
CUTANEOUS | Status: AC
  Filled 2020-08-06: qty 20000

## 2020-08-06 MED ORDER — ACETAMINOPHEN 325 MG PO TABS: 650.0000 mg | ORAL_TABLET | ORAL | Status: DC | PRN

## 2020-08-06 MED ORDER — LIDOCAINE 2% (20 MG/ML) 5 ML SYRINGE
INTRAMUSCULAR | Status: DC | PRN
  Administered 2020-08-06: 100 mg via INTRAVENOUS

## 2020-08-06 MED ORDER — CYCLOBENZAPRINE HCL 10 MG PO TABS
10.0000 mg | ORAL_TABLET | Freq: Three times a day (TID) | ORAL | Status: DC | PRN
  Administered 2020-08-06 – 2020-08-07 (×2): 10 mg via ORAL
  Filled 2020-08-06: qty 1

## 2020-08-06 MED ORDER — AMISULPRIDE (ANTIEMETIC) 5 MG/2ML IV SOLN: 10.0000 mg | Freq: Once | INTRAVENOUS | Status: DC | PRN

## 2020-08-06 MED ORDER — PANTOPRAZOLE SODIUM 40 MG IV SOLR: 40.0000 mg | Freq: Every day | INTRAVENOUS | Status: DC

## 2020-08-06 MED ORDER — CEFAZOLIN SODIUM-DEXTROSE 2-4 GM/100ML-% IV SOLN
INTRAVENOUS | Status: AC
  Filled 2020-08-06: qty 100

## 2020-08-06 MED ORDER — FENTANYL CITRATE (PF) 100 MCG/2ML IJ SOLN
25.0000 ug | INTRAMUSCULAR | Status: DC | PRN
  Administered 2020-08-06: 25 ug via INTRAVENOUS

## 2020-08-06 MED ORDER — SODIUM CHLORIDE 0.9% FLUSH
3.0000 mL | Freq: Two times a day (BID) | INTRAVENOUS | Status: DC
  Administered 2020-08-06 – 2020-08-07 (×2): 3 mL via INTRAVENOUS

## 2020-08-06 MED ORDER — ORAL CARE MOUTH RINSE: 15.0000 mL | Freq: Once | OROMUCOSAL | Status: AC

## 2020-08-06 MED ORDER — MIDAZOLAM HCL 2 MG/2ML IJ SOLN
INTRAMUSCULAR | Status: AC
  Filled 2020-08-06: qty 2

## 2020-08-06 MED ORDER — ONDANSETRON HCL 4 MG PO TABS: 4.0000 mg | ORAL_TABLET | Freq: Four times a day (QID) | ORAL | Status: DC | PRN

## 2020-08-06 MED ORDER — CEFAZOLIN SODIUM-DEXTROSE 2-4 GM/100ML-% IV SOLN
2.0000 g | INTRAVENOUS | Status: AC
  Administered 2020-08-06: 2 g via INTRAVENOUS

## 2020-08-06 MED ORDER — CYCLOBENZAPRINE HCL 10 MG PO TABS
ORAL_TABLET | ORAL | Status: AC
  Filled 2020-08-06: qty 1

## 2020-08-06 MED ORDER — ONDANSETRON HCL 4 MG/2ML IJ SOLN
INTRAMUSCULAR | Status: DC | PRN
  Administered 2020-08-06: 4 mg via INTRAVENOUS

## 2020-08-06 MED ORDER — SUFENTANIL CITRATE 50 MCG/ML IV SOLN
INTRAVENOUS | Status: AC
  Filled 2020-08-06: qty 1

## 2020-08-06 MED ORDER — GABAPENTIN 300 MG PO CAPS
300.0000 mg | ORAL_CAPSULE | Freq: Every evening | ORAL | Status: DC
  Administered 2020-08-06: 300 mg via ORAL
  Filled 2020-08-06: qty 1

## 2020-08-06 MED ORDER — EPHEDRINE SULFATE-NACL 50-0.9 MG/10ML-% IV SOSY
PREFILLED_SYRINGE | INTRAVENOUS | Status: DC | PRN
  Administered 2020-08-06 (×3): 10 mg via INTRAVENOUS

## 2020-08-06 MED ORDER — ALUM & MAG HYDROXIDE-SIMETH 200-200-20 MG/5ML PO SUSP: 30.0000 mL | Freq: Four times a day (QID) | ORAL | Status: DC | PRN

## 2020-08-06 MED ORDER — CHLORHEXIDINE GLUCONATE 0.12 % MT SOLN
OROMUCOSAL | Status: AC
  Administered 2020-08-06: 15 mL via OROMUCOSAL
  Filled 2020-08-06: qty 15

## 2020-08-06 MED ORDER — PHENYLEPHRINE HCL-NACL 10-0.9 MG/250ML-% IV SOLN
INTRAVENOUS | Status: DC | PRN
  Administered 2020-08-06: 25 ug/min via INTRAVENOUS
  Administered 2020-08-06: 5 ug/min via INTRAVENOUS

## 2020-08-06 MED ORDER — TRIAMCINOLONE ACETONIDE 0.1 % EX CREA
1.0000 "application " | TOPICAL_CREAM | Freq: Every day | CUTANEOUS | Status: DC | PRN
  Filled 2020-08-06: qty 15

## 2020-08-06 MED ORDER — 0.9 % SODIUM CHLORIDE (POUR BTL) OPTIME
TOPICAL | Status: DC | PRN
  Administered 2020-08-06: 1000 mL

## 2020-08-06 MED ORDER — CHLORHEXIDINE GLUCONATE 0.12 % MT SOLN: 15.0000 mL | Freq: Once | OROMUCOSAL | Status: AC

## 2020-08-06 MED ORDER — FENTANYL CITRATE (PF) 100 MCG/2ML IJ SOLN
INTRAMUSCULAR | Status: AC
  Filled 2020-08-06: qty 2

## 2020-08-06 MED ORDER — PRAVASTATIN SODIUM 10 MG PO TABS: 10.0000 mg | ORAL_TABLET | Freq: Every day | ORAL | Status: DC

## 2020-08-06 MED ORDER — SODIUM CHLORIDE 0.9 % IV SOLN: 250.0000 mL | INTRAVENOUS | Status: DC

## 2020-08-06 MED ORDER — DEXAMETHASONE SODIUM PHOSPHATE 10 MG/ML IJ SOLN
10.0000 mg | Freq: Four times a day (QID) | INTRAMUSCULAR | Status: DC
  Administered 2020-08-07 (×3): 10 mg via INTRAVENOUS
  Filled 2020-08-06 (×3): qty 1

## 2020-08-06 MED ORDER — LIDOCAINE-EPINEPHRINE 1 %-1:100000 IJ SOLN
INTRAMUSCULAR | Status: AC
  Filled 2020-08-06: qty 1

## 2020-08-06 MED ORDER — ONDANSETRON HCL 4 MG/2ML IJ SOLN: 4.0000 mg | Freq: Four times a day (QID) | INTRAMUSCULAR | Status: DC | PRN

## 2020-08-06 MED ORDER — PROPOFOL 10 MG/ML IV BOLUS
INTRAVENOUS | Status: AC
  Filled 2020-08-06: qty 20

## 2020-08-06 MED ORDER — HYDROCODONE-ACETAMINOPHEN 5-325 MG PO TABS: 1.0000 | ORAL_TABLET | Freq: Three times a day (TID) | ORAL | Status: DC | PRN

## 2020-08-06 MED ORDER — HYDROMORPHONE HCL 1 MG/ML IJ SOLN
0.5000 mg | INTRAMUSCULAR | Status: DC | PRN
  Administered 2020-08-06: 0.5 mg via INTRAVENOUS
  Filled 2020-08-06: qty 0.5

## 2020-08-06 MED ORDER — CHLORHEXIDINE GLUCONATE CLOTH 2 % EX PADS: 6.0000 | MEDICATED_PAD | Freq: Once | CUTANEOUS | Status: DC

## 2020-08-06 MED ORDER — SODIUM CHLORIDE 0.9% FLUSH: 3.0000 mL | INTRAVENOUS | Status: DC | PRN

## 2020-08-06 MED ORDER — ACETAMINOPHEN 650 MG RE SUPP: 650.0000 mg | RECTAL | Status: DC | PRN

## 2020-08-06 MED ORDER — SUGAMMADEX SODIUM 200 MG/2ML IV SOLN
INTRAVENOUS | Status: DC | PRN
  Administered 2020-08-06: 200 mg via INTRAVENOUS

## 2020-08-06 MED ORDER — PHENOL 1.4 % MT LIQD: 1.0000 | OROMUCOSAL | Status: DC | PRN

## 2020-08-06 MED ORDER — SODIUM CHLORIDE (PF) 0.9 % IJ SOLN
INTRAMUSCULAR | Status: AC
  Filled 2020-08-06: qty 10

## 2020-08-06 MED ORDER — DEXAMETHASONE SODIUM PHOSPHATE 10 MG/ML IJ SOLN
10.0000 mg | Freq: Once | INTRAMUSCULAR | Status: AC
  Administered 2020-08-06: 10 mg via INTRAVENOUS
  Filled 2020-08-06: qty 1

## 2020-08-06 MED ORDER — MIDAZOLAM HCL 2 MG/2ML IJ SOLN
INTRAMUSCULAR | Status: DC | PRN
  Administered 2020-08-06: 2 mg via INTRAVENOUS

## 2020-08-06 MED ORDER — LIDOCAINE-EPINEPHRINE 1 %-1:100000 IJ SOLN
INTRAMUSCULAR | Status: DC | PRN
  Administered 2020-08-06: 10 mL

## 2020-08-06 MED ORDER — SUFENTANIL CITRATE 50 MCG/ML IV SOLN
INTRAVENOUS | Status: DC | PRN
  Administered 2020-08-06 (×2): 15 ug via INTRAVENOUS

## 2020-08-06 MED ORDER — ACETAMINOPHEN 500 MG PO TABS
1000.0000 mg | ORAL_TABLET | Freq: Once | ORAL | Status: AC
  Administered 2020-08-06: 1000 mg via ORAL
  Filled 2020-08-06: qty 2

## 2020-08-06 MED ORDER — BUPIVACAINE HCL (PF) 0.25 % IJ SOLN
INTRAMUSCULAR | Status: DC | PRN
  Administered 2020-08-06: 20 mL

## 2020-08-06 MED ORDER — LISINOPRIL-HYDROCHLOROTHIAZIDE 20-25 MG PO TABS: 1.0000 | ORAL_TABLET | Freq: Every day | ORAL | Status: DC

## 2020-08-06 SURGICAL SUPPLY — 64 items
ADH SKN CLS APL DERMABOND .7 (GAUZE/BANDAGES/DRESSINGS) ×3
APL SKNCLS STERI-STRIP NONHPOA (GAUZE/BANDAGES/DRESSINGS) ×3
BAND INSRT 18 STRL LF DISP RB (MISCELLANEOUS) ×6
BAND RUBBER #18 3X1/16 STRL (MISCELLANEOUS) ×10 IMPLANT
BENZOIN TINCTURE PRP APPL 2/3 (GAUZE/BANDAGES/DRESSINGS) ×5 IMPLANT
BLADE CLIPPER SURG (BLADE) IMPLANT
BLADE SURG 11 STRL SS (BLADE) ×5 IMPLANT
BUR MATCHSTICK NEURO 3.0 LAGG (BURR) ×5 IMPLANT
BUR PRECISION FLUTE 6.0 (BURR) ×5 IMPLANT
CANISTER SUCT 3000ML PPV (MISCELLANEOUS) ×5 IMPLANT
CARTRIDGE OIL MAESTRO DRILL (MISCELLANEOUS) ×3 IMPLANT
CLOSURE WOUND 1/2 X4 (GAUZE/BANDAGES/DRESSINGS) ×1
COVER WAND RF STERILE (DRAPES) ×3 IMPLANT
DECANTER SPIKE VIAL GLASS SM (MISCELLANEOUS) ×5 IMPLANT
DERMABOND ADVANCED (GAUZE/BANDAGES/DRESSINGS) ×2
DERMABOND ADVANCED .7 DNX12 (GAUZE/BANDAGES/DRESSINGS) ×3 IMPLANT
DIFFUSER DRILL AIR PNEUMATIC (MISCELLANEOUS) ×5 IMPLANT
DRAPE HALF SHEET 40X57 (DRAPES) ×2 IMPLANT
DRAPE LAPAROTOMY 100X72X124 (DRAPES) ×5 IMPLANT
DRAPE MICROSCOPE LEICA (MISCELLANEOUS) ×5 IMPLANT
DRAPE SURG 17X23 STRL (DRAPES) ×5 IMPLANT
DRSG OPSITE 4X5.5 SM (GAUZE/BANDAGES/DRESSINGS) ×4 IMPLANT
DRSG OPSITE POSTOP 4X6 (GAUZE/BANDAGES/DRESSINGS) ×2 IMPLANT
DRSG OPSITE POSTOP 4X8 (GAUZE/BANDAGES/DRESSINGS) ×2 IMPLANT
ELECT REM PT RETURN 9FT ADLT (ELECTROSURGICAL) ×5
ELECTRODE REM PT RTRN 9FT ADLT (ELECTROSURGICAL) ×3 IMPLANT
EVACUATOR 1/8 PVC DRAIN (DRAIN) ×2 IMPLANT
GAUZE 4X4 16PLY RFD (DISPOSABLE) IMPLANT
GAUZE SPONGE 4X4 12PLY STRL (GAUZE/BANDAGES/DRESSINGS) ×7 IMPLANT
GAUZE SPONGE 4X4 12PLY STRL LF (GAUZE/BANDAGES/DRESSINGS) ×2 IMPLANT
GLOVE BIO SURGEON STRL SZ7 (GLOVE) IMPLANT
GLOVE BIO SURGEON STRL SZ8 (GLOVE) ×5 IMPLANT
GLOVE BIOGEL PI IND STRL 6.5 (GLOVE) IMPLANT
GLOVE BIOGEL PI IND STRL 7.0 (GLOVE) IMPLANT
GLOVE BIOGEL PI IND STRL 7.5 (GLOVE) IMPLANT
GLOVE BIOGEL PI INDICATOR 6.5 (GLOVE) ×2
GLOVE BIOGEL PI INDICATOR 7.0 (GLOVE)
GLOVE BIOGEL PI INDICATOR 7.5 (GLOVE) ×2
GLOVE ECLIPSE 7.5 STRL STRAW (GLOVE) IMPLANT
GLOVE EXAM NITRILE XL STR (GLOVE) IMPLANT
GLOVE INDICATOR 8.5 STRL (GLOVE) ×5 IMPLANT
GLOVE SURG SS PI 6.0 STRL IVOR (GLOVE) ×6 IMPLANT
GOWN STRL REUS W/ TWL LRG LVL3 (GOWN DISPOSABLE) ×3 IMPLANT
GOWN STRL REUS W/ TWL XL LVL3 (GOWN DISPOSABLE) ×6 IMPLANT
GOWN STRL REUS W/TWL 2XL LVL3 (GOWN DISPOSABLE) IMPLANT
GOWN STRL REUS W/TWL LRG LVL3 (GOWN DISPOSABLE) ×5
GOWN STRL REUS W/TWL XL LVL3 (GOWN DISPOSABLE) ×5
KIT BASIN OR (CUSTOM PROCEDURE TRAY) ×5 IMPLANT
KIT TURNOVER KIT B (KITS) ×5 IMPLANT
NDL SPNL 22GX3.5 QUINCKE BK (NEEDLE) ×3 IMPLANT
NEEDLE HYPO 22GX1.5 SAFETY (NEEDLE) ×5 IMPLANT
NEEDLE SPNL 22GX3.5 QUINCKE BK (NEEDLE) ×5 IMPLANT
NS IRRIG 1000ML POUR BTL (IV SOLUTION) ×5 IMPLANT
OIL CARTRIDGE MAESTRO DRILL (MISCELLANEOUS) ×5
PACK LAMINECTOMY NEURO (CUSTOM PROCEDURE TRAY) ×5 IMPLANT
SPONGE SURGIFOAM ABS GEL SZ50 (HEMOSTASIS) ×7 IMPLANT
STRIP CLOSURE SKIN 1/2X4 (GAUZE/BANDAGES/DRESSINGS) ×4 IMPLANT
SUT VIC AB 0 CT1 18XCR BRD8 (SUTURE) ×3 IMPLANT
SUT VIC AB 0 CT1 8-18 (SUTURE) ×5
SUT VIC AB 2-0 CT1 18 (SUTURE) ×7 IMPLANT
SUT VICRYL 4-0 PS2 18IN ABS (SUTURE) ×5 IMPLANT
TOWEL GREEN STERILE (TOWEL DISPOSABLE) ×5 IMPLANT
TOWEL GREEN STERILE FF (TOWEL DISPOSABLE) ×5 IMPLANT
WATER STERILE IRR 1000ML POUR (IV SOLUTION) ×5 IMPLANT

## 2020-08-06 NOTE — Anesthesia Postprocedure Evaluation (Signed)
Anesthesia Post Note  Patient: Jose Kline  Procedure(s) Performed: Laminectomy and Foraminotomy - Thoracic nine-ten, Thoracic ten-eleven, Thoracic eleven-twelve (N/A Back) THORACIC DECOMPRESSION LEVELS T9-T12 THORACIC LAMINECTOMY/DECOMPRESSION MICRODISCECTOMY LEVEL T11-T12 (Back)     Patient location during evaluation: PACU Anesthesia Type: General Level of consciousness: awake and alert Pain management: pain level controlled Vital Signs Assessment: post-procedure vital signs reviewed and stable Respiratory status: spontaneous breathing, nonlabored ventilation, respiratory function stable and patient connected to nasal cannula oxygen Cardiovascular status: blood pressure returned to baseline and stable Postop Assessment: no apparent nausea or vomiting Anesthetic complications: no   No complications documented.  Last Vitals:  Vitals:   08/06/20 2047 08/06/20 2106  BP: 111/69 129/84  Pulse: 69 67  Resp: 18 18  Temp: 36.6 C 36.5 C  SpO2: 93% 100%    Last Pain:  Vitals:   08/06/20 2154  TempSrc:   PainSc: Asleep                 Belenda Cruise P Allante Beane

## 2020-08-06 NOTE — Anesthesia Procedure Notes (Signed)
Procedure Name: Intubation Date/Time: 08/06/2020 5:08 PM Performed by: Moshe Salisbury, CRNA Pre-anesthesia Checklist: Patient identified, Emergency Drugs available, Suction available and Patient being monitored Patient Re-evaluated:Patient Re-evaluated prior to induction Oxygen Delivery Method: Circle System Utilized Preoxygenation: Pre-oxygenation with 100% oxygen Induction Type: IV induction Ventilation: Mask ventilation without difficulty Laryngoscope Size: Mac and 4 Grade View: Grade I Tube type: Oral Tube size: 8.0 mm Number of attempts: 1 Airway Equipment and Method: Stylet Placement Confirmation: ETT inserted through vocal cords under direct vision,  positive ETCO2 and breath sounds checked- equal and bilateral Secured at: 23 cm Tube secured with: Tape Dental Injury: Teeth and Oropharynx as per pre-operative assessment

## 2020-08-06 NOTE — H&P (Addendum)
Jose Kline is an 69 y.o. male.   Chief Complaint: Back pain numbness tingling weakness in his legs HPI: 69 year old gentleman with progressive numbness tingling weakness in his legs work-up revealed severe cord compression primarily at T11-12 but also spinal stenosis at 910 and 1011 due to patient progression of clinical syndrome imaging findings and failed conservative treatment I recommended decompressive thoracic laminectomy from T9-T12 with possible microdiscectomy at T11-12 I extensively gone over the risks and benefits of that operation with him as well as perioperative course expectations of outcome and alternatives of surgery and he understands and agrees to proceed forward.  Past Medical History:  Diagnosis Date  . Anxiety    after wife passed away  . Complication of anesthesia    constipation  . DDD (degenerative disc disease), lumbosacral   . GERD (gastroesophageal reflux disease)   . Headache    migraines that cause him to throw up but none recently  . Hyperlipidemia   . Hypertension     Past Surgical History:  Procedure Laterality Date  . EYE SURGERY Bilateral    cataract extraction with implants  . HERNIA REPAIR     umbilical  . INGUINAL HERNIA REPAIR Right 12/29/2017   Procedure: HERNIA REPAIR INGUINAL ADULT;  Surgeon: Herbert Pun, MD;  Location: [facility] ORS;  Service: General;  Laterality: Right;  . INSERTION OF MESH Right 12/29/2017   Procedure: INSERTION OF MESH;  Surgeon: Herbert Pun, MD;  Location: [facility] ORS;  Service: General;  Laterality: Right;  . IR GENERIC HISTORICAL  12/25/2014   IR RADIOLOGIST EVAL & MGMT 12/25/2014 Greggory Keen, MD [facility] INTERV RAD  . SPINE SURGERY  04/2011   1990's was first back surgery.  2 rods in middle of back. Disc degenerative disease  . TONSILLECTOMY AND ADENOIDECTOMY     as a child    Family History  Problem Relation Age of Onset  . Cancer Mother        breast  . Kidney disease Mother    Social History:   reports that he has been smoking cigarettes. He started smoking about 53 years ago. He has a 75.00 pack-year smoking history. He quit smokeless tobacco use about 21 years ago.  His smokeless tobacco use included snuff. He reports current alcohol use. He reports that he does not use drugs.  Allergies:  Allergies  Allergen Reactions  . Soap Rash    Can not tolerate scented soaps. Very sensitive to skin    Medications Prior to Admission  Medication Sig Dispense Refill  . Aspirin-Caffeine (BC FAST PAIN RELIEF) 845-65 MG PACK Take 1 packet by mouth daily as needed (pain.).    Marland Kitchen gabapentin (NEURONTIN) 300 MG capsule Take 300 mg by mouth every evening.    Mar[place] Kitchen HYDROcodone-acetaminophen (NORCO/VICODIN) 5-325 MG tablet Take 1 tablet by mouth 3 (three) times daily as needed for moderate pain.     Mar[place] Kitchen lisinopril-hydrochlorothiazide (ZESTORETIC) 20-25 MG tablet Take 1 tablet by mouth daily.    Mar[place] Kitchen lovastatin (MEVACOR) 20 MG tablet Take 20 mg by mouth every evening.     Marland Kitchen omeprazole (PRILOSEC) 40 MG capsule Take 40 mg by mouth daily as needed (heartburn).     . triamcinolone cream (KENALOG) 0.1 % Apply 1 application topically daily as needed (rash).   99  . methocarbamol (ROBAXIN) 500 MG tablet Take 1 tablet (500 mg total) by mouth 4 (four) times daily. (Patient not taking: Reported on 07/28/2020) 40 tablet 0  . oxyCODONE-acetaminophen (PERCOCET) 5-325 MG tablet Take  1 tablet by mouth every 4 (four) hours as needed for severe pain. (Patient not taking: Reported on 07/28/2020) 20 tablet 0    No results found for this or any previous visit (from the past 48 hour(s)). No results found.  Review of Systems  Musculoskeletal: Positive for back pain.  Neurological: Positive for numbness.    Blood pressure 114/72, pulse 66, temperature 98.2 F (36.8 C), temperature source Oral, resp. rate 18, SpO2 96 %. Physical Exam HENT:     Head: Normocephalic.     Nose: Nose normal.     Mouth/Throat:     Mouth: Mucous  membranes are moist.  Eyes:     Pupils: Pupils are equal, round, and reactive to light.  Cardiovascular:     Rate and Rhythm: Normal rate.  Abdominal:     General: Abdomen is flat.  Musculoskeletal:        General: Normal range of motion.  Skin:    General: Skin is warm.  Neurological:     Mental Status: He is alert.     Comments: Awake alert strength is 5 out of 5 iliopsoas bilaterally left lower extremity is 4 out of 5 quad hamstring and he has a virtual complete left foot drop right lower extremity is 5-5 quads hamstrings but he also has a partial right foot drop he is ataxic and reflexes are brisk      Assessment/Plan 69 year old presents for decompressive thoracic laminectomy T9-T12 with transpedicular microdiscectomy T11-12 on the right  Elaina Hoops, MD 08/06/2020, 4:09 PM

## 2020-08-06 NOTE — Transfer of Care (Signed)
Immediate Anesthesia Transfer of Care Note  Patient: Jose Kline  Procedure(s) Performed: Laminectomy and Foraminotomy - Thoracic nine-ten, Thoracic ten-eleven, Thoracic eleven-twelve (N/A Back) THORACIC DECOMPRESSION LEVELS T9-T12 THORACIC LAMINECTOMY/DECOMPRESSION MICRODISCECTOMY LEVEL T11-T12 (Back)  Patient Location: PACU  Anesthesia Type:General  Level of Consciousness: awake, alert , oriented and patient cooperative  Airway & Oxygen Therapy: Patient Spontanous Breathing  Post-op Assessment: Report given to RN and Post -op Vital signs reviewed and stable  Post vital signs: Reviewed and stable  Last Vitals:  Vitals Value Taken Time  BP 110/65 08/06/20 1938  Temp    Pulse 80 08/06/20 1942  Resp 16 08/06/20 1942  SpO2 96 % 08/06/20 1942  Vitals shown include unvalidated device data.  Last Pain:  Vitals:   08/06/20 1158  TempSrc:   PainSc: 6       Patients Stated Pain Goal: 3 (34/96/11 6435)  Complications: No complications documented.

## 2020-08-06 NOTE — Op Note (Signed)
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  Thoracic discectomy from the right T11-12 utilizing the operating microscope  Surgeon: Dominica Severin Jashawn Floyd  Assistant: Ashok Pall  Anesthesia: General  EBL: Minimal  HPI: 69 year old gentleman progressive worsening back bilateral leg pain numbness tingling or weakness.  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  Due to patient progression of clinical syndrome imaging findings and failed conservative treatment I recommended decompressive thoracic laminectomies from T9-T12 with possible discectomy at T11-12.  I extensively reviewed the risks and benefits of the operation with the patient as well as perioperative course expectations of outcome and alternatives to surgery and he understood and agreed to proceed forward.  Operative procedure: Patient was brought into the OR was due to general anesthesia positioned prone the Wilson frame his back was prepped and draped in routine sterile fashion.  His old incision extended up to L1 so this was used as a anatomic landmark and I extended an incision above this subperiosteal dissection was carried lamina of T9, T10, T11, T12 confirmed with intraoperative x-ray.  I then remove the spinous processes of T9-T10-T11 part of the superior aspect spinous process of T12 drilled down the lamina to thinned it out then performed central decompression with a 2 mm Kerrison punch.  I then removed significantly hypertrophied ligament and then under bit the medial facet complexes with severe hourglass compression of thecal sac primarily at T11-T12 and T10-11.  T9-10 also was  moderately stenotic.  After I under bit the medial facet complex and performed foraminotomies of the T10-T11 and T12 nerve roots I then brought the operating microscope into the field and microscope illumination confirmed the adequate decompression of foraminotomies and extended him inspected the T11-12 disc waist up on both sides.  On the left side there was a small calcified spur that was not compressive I left this alone from the right side there was a broad-based disc bulge that I felt I had to try to incise and get some fragments out of.  So under microscope illumination I conservatively tried to drill a little bit of the medial facet superior pedicle without destabilizing the entire facet joints I performed a bilateral thoracic laminectomy.  Into the disc space and with nerve hooks Epstein curettes and micropituitary's removed several fragments of disc.  It was partially calcified the disc base is markedly collapsed I was not able to remove all of the broad-based disc herniation secondary to the calcification collapse and reluctance to be too aggressive and destabilizing.  But I did decompress the ventral part of the spinal cord in the right easily passing nerve hook and black nerve hook all along the ventral aspect of the dura and the dorsal aspect already been decompressed.  So at this point after adequate decompression been achieved the wound was copiously irrigated to Kassim states was maintained Gelfoam was ON top of the dura a medium Hemovac drain was placed and the wound was closed in layers with interrupted Vicryl in a running 4 subcuticular in the skin Dermabond benzoin Steri-Strips and a sterile dressing was applied patient cover him in stable condition.  At the end the case all needle counts and sponge counts were correct.

## 2020-08-06 NOTE — Anesthesia Preprocedure Evaluation (Signed)
Anesthesia Evaluation  Patient identified by MRN, date of birth, ID band Patient awake    Reviewed: Allergy & Precautions, NPO status , Patient's Chart, lab work & pertinent test results  Airway Mallampati: II  TM Distance: >3 FB Neck ROM: Full    Dental  (+) Dental Advisory Given   Pulmonary Current Smoker,    breath sounds clear to auscultation       Cardiovascular hypertension, Pt. on medications  Rhythm:Regular Rate:Normal     Neuro/Psych negative neurological ROS     GI/Hepatic Neg liver ROS, GERD  ,  Endo/Other  negative endocrine ROS  Renal/GU negative Renal ROS     Musculoskeletal  (+) Arthritis ,   Abdominal   Peds  Hematology negative hematology ROS (+)   Anesthesia Other Findings   Reproductive/Obstetrics                             Lab Results  Component Value Date   WBC 8.9 08/04/2020   HGB 12.6 (L) 08/04/2020   HCT 37.8 (L) 08/04/2020   MCV 93.6 08/04/2020   PLT 280 08/04/2020   Lab Results  Component Value Date   CREATININE 0.95 08/04/2020   BUN 15 08/04/2020   NA 134 (L) 08/04/2020   K 3.1 (L) 08/04/2020   CL 99 08/04/2020   CO2 24 08/04/2020    Anesthesia Physical Anesthesia Plan  ASA: II  Anesthesia Plan: General   Post-op Pain Management:    Induction: Intravenous  PONV Risk Score and Plan: 1 and Dexamethasone, Ondansetron and Treatment may vary due to age or medical condition  Airway Management Planned: Oral ETT  Additional Equipment:   Intra-op Plan:   Post-operative Plan: Extubation in OR  Informed Consent: I have reviewed the patients History and Physical, chart, labs and discussed the procedure including the risks, benefits and alternatives for the proposed anesthesia with the patient or authorized representative who has indicated his/her understanding and acceptance.     Dental advisory given  Plan Discussed with:  CRNA  Anesthesia Plan Comments:         Anesthesia Quick Evaluation

## 2020-08-07 ENCOUNTER — Encounter (HOSPITAL_COMMUNITY): Payer: Self-pay | Admitting: Neurosurgery

## 2020-08-07 DIAGNOSIS — M4804 Spinal stenosis, thoracic region: Secondary | ICD-10-CM | POA: Diagnosis not present

## 2020-08-07 MED ORDER — OXYCODONE-ACETAMINOPHEN 5-325 MG PO TABS: 1.0000 | ORAL_TABLET | ORAL | 0 refills | Status: AC | PRN

## 2020-08-07 MED ORDER — METHOCARBAMOL 500 MG PO TABS
500.0000 mg | ORAL_TABLET | Freq: Four times a day (QID) | ORAL | 0 refills | Status: DC
Start: 2020-08-07 — End: 2022-09-10

## 2020-08-07 NOTE — Evaluation (Signed)
Occupational Therapy Evaluation Patient Details Name: Jose Kline MRN: [id number] DOB: [birth year] Today's Date: 08/07/2020    History of Present Illness (P) 69 year old gentleman progressive worsening back bilateral leg pain numbness tingling or weakness.  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  He underwent decompressive thoracic laminectomies T9-10, T10-11, T11-12 bilaterally with foraminotomies of the T10-T11-T12 nerve roots, andThoracic discectomy from the right T11-12.   Clinical Impression   Patient admitted with the above diagnosis.  Presents with premorbid unsteadiness on his feet, and mild discomfort to surgical site.  Drain is still in place.  He is close to or near baseline for self care and in room functional mobility.  [facility] OT and PT have been recommended, and would help with a transition home to ensure safety.  OT can certainly assist with any further recommendations in the home environment as needed.  No further OT needs in the acute setting.      Follow Up Recommendations  Home health OT    Equipment Recommendations  None recommended by OT    Recommendations for Other Services       Precautions / Restrictions Precautions Precautions: (P) Fall;Back Precaution Booklet Issued: (P) Yes (comment) Restrictions Weight Bearing Restrictions: (P) No Other Position/Activity Restrictions: (P) no brace needed      Mobility Bed Mobility Overal bed mobility: Modified Independent             General bed mobility comments: VC's to remember log roll - but able to complete.  Transfers Overall transfer level: Modified independent Equipment used: Rolling walker (2 wheeled)             General transfer comment: Patient is a little concerned about stairs, but was able to practice with PT this morning.  Understands he will need someone there with him to enter the home  today.    Balance Overall balance assessment: Mild deficits observed, not formally tested                                         ADL either performed or assessed with clinical judgement   ADL Overall ADL's : At baseline                                       General ADL Comments: Able to complete light sponge bath seated bedside.  Stood for peri care at [facility] level.  Able to dress himself sit/stand with RW during pant management.     Vision Baseline Vision/History: No visual deficits Patient Visual Report: No change from baseline       Perception     Praxis      Pertinent Vitals/Pain Pain Assessment: Faces Faces Pain Scale: Hurts a little bit Pain Descriptors / Indicators: Dull Pain Intervention(s): Monitored during session     Hand Dominance Right   Extremity/Trunk Assessment Upper Extremity Assessment Upper Extremity Assessment: Overall WFL for tasks assessed   Lower Extremity Assessment Lower Extremity Assessment: Defer to PT evaluation   Cervical / Trunk Assessment Cervical / Trunk Assessment: Kyphotic   Communication Communication Communication: No difficulties   Cognition Arousal/Alertness: Awake/alert Behavior During Therapy: WFL for tasks assessed/performed Overall Cognitive Status: Within Functional Limits for tasks assessed  Home Living Family/patient expects to be discharged to:: (P) Private residence Living Arrangements: (P) Alone Available Help at Discharge: (P) Family;Available 24 hours/day Type of Home: (P) House Home Access: (P) Stairs to enter   Entrance Stairs-Rails: Right;Left Home Layout: One level     Bathroom Shower/Tub: Teacher, early years/pre: Handicapped height     Home Equipment: [place] - single point;Walker - 2 wheels          Prior Functioning/Environment Level of Independence: Independent with assistive  device(s)                 OT Problem List: Decreased strength;Impaired balance (sitting and/or standing)      OT Treatment/Interventions:      OT Goals(Current goals can be found in the care plan section) Acute Rehab OT Goals Patient Stated Goal: Trying to figure out if I'm going home today. OT Goal Formulation: With patient Time For Goal Achievement: 08/11/20 Potential to Achieve Goals: Good  OT Frequency:     Barriers to D/C:  none noted, MD cleared to go home.            Co-evaluation              AM-PAC OT "6 Clicks" Daily Activity     Outcome Measure Help from another person eating meals?: None Help from another person taking care of personal grooming?: None Help from another person toileting, which includes using toliet, bedpan, or urinal?: None Help from another person bathing (including washing, rinsing, drying)?: A Little Help from another person to put on and taking off regular upper body clothing?: None Help from another person to put on and taking off regular lower body clothing?: None 6 Click Score: 23   End of Session Equipment Utilized During Treatment: Rolling walker Nurse Communication: Other (comment) (patient dressed and contacted grand daughter regarding discharge.)  Activity Tolerance: Patient tolerated treatment well Patient left: in bed;with call bell/phone within reach  OT Visit Diagnosis: Unsteadiness on feet (R26.81);Muscle weakness (generalized) (M62.81)                Time: 1010-1026 OT Time Calculation (min): 16 min Charges:  OT General Charges $OT Visit: 1 Visit OT Evaluation $OT Eval Moderate Complexity: 1 Mod  08/07/2020  Rich, OTR/L  Acute Rehabilitation Services  Office:  [place] 08/07/2020, 10:37 AM

## 2020-08-07 NOTE — Progress Notes (Addendum)
Patient is discharged from room 3C08 at this time. Alert and in stable condition. IV site d/c'd as well as hemovac. Instructions read to patient and daughter with understanding verbalized and all questions answered. Left unit via wheelchair with all belongings at side.

## 2020-08-07 NOTE — Care Management CC44 (Signed)
Condition Code 44 Documentation Completed  Patient Details  Name: HASANI DIEMER MRN: [id number] Date of Birth: February 16, 1951   Condition Code 44 given:  Yes Patient signature on Condition Code 44 notice:  Yes Documentation of 2 MD's agreement:  Yes Code 44 added to claim:  Yes    Angelita Ingles, RN 08/07/2020, 12:12 PM

## 2020-08-07 NOTE — Evaluation (Signed)
Physical Therapy Evaluation Patient Details Name: Jose Kline MRN: [id number] DOB: 17-Jan-1951 Today's Date: 08/07/2020   History of Present Illness  69 year old gentleman progressive worsening back bilateral leg pain numbness tingling or weakness.  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  He underwent decompressive thoracic laminectomies T9-10, T10-11, T11-12 bilaterally with foraminotomies of the T10-T11-T12 nerve roots, andThoracic discectomy from the right T11-12.    Clinical Impression  Pt in bed upon arrival of PT, agreeable to evaluation at this time. Prior to admission the pt was mobilizing with use of a RW and limited to about 10 minutes of activity due to his pain. The pt lives alone in a home with 3 steps to enter, but reports he has multiple family members who live nearby and can provide 24/7 supervision and assist as needed. The pt now presents with limitations in functional mobility, strength, activity tolerance, coordination, and stability due to above dx and chronic debility, and will continue to benefit from skilled PT to address these deficits. He was able to complete ~45 ft of mobility at this time with use of RW and minA to maintain positioning and stability with gait. The pt was able to complete 1 step up x2, but had one instance of L knee buckling requiring maxA to maintain balance. The pt will continue to benefit from skilled therapies to improve strength and stability in order to improve safety with mobility in the home (the pt reports frequent falls at home prior to surgery).     Follow Up Recommendations Home health PT;Supervision/Assistance - 24 hour    Equipment Recommendations  3in1 (PT)    Recommendations for Other Services       Precautions / Restrictions Precautions Precautions: Fall;Back Precaution Booklet Issued: Yes (comment) Restrictions Weight Bearing  Restrictions: No Other Position/Activity Restrictions: no brace needed      Mobility  Bed Mobility Overal bed mobility: Needs Assistance Bed Mobility: Rolling;Sidelying to Sit;Sit to Sidelying Rolling: Supervision Sidelying to sit: Supervision     Sit to sidelying: Supervision General bed mobility comments: VC's to remember log roll - but able to complete.  Transfers Overall transfer level: Modified independent Equipment used: Rolling walker (2 wheeled)             General transfer comment: minG with VC for hand placement, no assist to rise but pt stands with significant trunk flexion  Ambulation/Gait Ambulation/Gait assistance: Min assist;Min guard Gait Distance (Feet): 45 Feet Assistive device: Rolling walker (2 wheeled) Gait Pattern/deviations: Step-through pattern;Decreased stride length;Decreased dorsiflexion - right;Decreased dorsiflexion - left;Trunk flexed Gait velocity: decreased Gait velocity interpretation: <1.31 ft/sec, indicative of household ambulator General Gait Details: poor DF of BLE, RLE worse than LLE. Pt reports having a brace for his RLE at home but that he does not wear it often. uses circumduction with BLE to clear toes for each step  Stairs Stairs: Yes Stairs assistance: Mod assist;Max assist Stair Management: One rail Left;Step to pattern Number of Stairs: 1 (x2) General stair comments: the pt completed 1 step x2 with mod/maxA to maintain balance and power up to stair with RLE leading step up. one instance of knee buckling requiring maxA to maintain stability  Wheelchair Mobility    Modified Rankin (Stroke Patients Only)       Balance Overall balance assessment: Needs assistance Sitting-balance support: No upper extremity supported;Feet supported Sitting balance-Leahy Scale: Good     Standing balance support: Bilateral upper extremity  supported Standing balance-Leahy Scale: Poor Standing balance comment: reliant on at least single UE  support to maintain standing                             Pertinent Vitals/Pain Pain Assessment: Faces Faces Pain Scale: Hurts little more Pain Location: back (with bed mobility) Pain Descriptors / Indicators: Discomfort;Sore Pain Intervention(s): Limited activity within patient's tolerance;Monitored during session;Repositioned;Patient requesting pain meds-RN notified    Home Living Family/patient expects to be discharged to:: Private residence Living Arrangements: Alone Available Help at Discharge: Family;Available 24 hours/day Type of Home: House Home Access: Stairs to enter Entrance Stairs-Rails: Psychiatric nurse of Steps: 3 Home Layout: One level Home Equipment: Walker - 2 wheels;Cane - single point;Grab bars - tub/shower;Hand held shower head;Wheelchair - manual      Prior Function Level of Independence: Independent with assistive device(s)         Comments: pt reports limited mostly by pain, has AFO for LLE but does not wear due to discomfort     Hand Dominance   Dominant Hand: Right    Extremity/Trunk Assessment   Upper Extremity Assessment Upper Extremity Assessment: Defer to OT evaluation    Lower Extremity Assessment Lower Extremity Assessment: Generalized weakness;RLE deficits/detail;LLE deficits/detail RLE Deficits / Details: grossly 3+/4 other than R ankle DF which pt was unable to actively DF against gravity.  RLE Sensation: WNL LLE Deficits / Details: grossly 3+/5, one instance of buckling with need for maxA, unable to actively DF at ankls LLE Sensation: WNL    Cervical / Trunk Assessment Cervical / Trunk Assessment: Kyphotic  Communication   Communication: No difficulties  Cognition Arousal/Alertness: Awake/alert Behavior During Therapy: WFL for tasks assessed/performed Overall Cognitive Status: No family/caregiver present to determine baseline cognitive functioning                                  General Comments: pt does require cues for safety, posture, positioning, and demos slight decrease in awareness of deficits/safety, but per RN who is familiar with pt from prior visits, this is near his baseline      General Comments      Exercises General Exercises - Lower Extremity Ankle Circles/Pumps: AAROM;AROM;Both;10 reps;Supine (AAROM x10, pt attempting to complete without assist for HEP)   Assessment/Plan    PT Assessment Patient needs continued PT services  PT Problem List Decreased strength;Decreased activity tolerance;Decreased balance;Decreased mobility;Decreased coordination;Decreased knowledge of use of DME;Decreased safety awareness       PT Treatment Interventions DME instruction;Gait training;Stair training;Functional mobility training;Therapeutic activities;Therapeutic exercise;Balance training;Patient/family education    PT Goals (Current goals can be found in the Care Plan section)  Acute Rehab PT Goals Patient Stated Goal: Trying to figure out if I'm going home today. PT Goal Formulation: With patient Time For Goal Achievement: 08/21/20 Potential to Achieve Goals: Fair    Frequency Min 5X/week    AM-PAC PT "6 Clicks" Mobility  Outcome Measure Help needed turning from your back to your side while in a flat bed without using bedrails?: A Little Help needed moving from lying on your back to sitting on the side of a flat bed without using bedrails?: A Little Help needed moving to and from a bed to a chair (including a wheelchair)?: A Lot Help needed standing up from a chair using your arms (e.g., wheelchair or bedside chair)?: A Little Help  needed to walk in hospital room?: A Little Help needed climbing 3-5 steps with a railing? : A Lot 6 Click Score: 16    End of Session Equipment Utilized During Treatment: Gait belt Activity Tolerance: Patient limited by fatigue;Patient tolerated treatment well Patient left: in bed;with call bell/phone within  reach Nurse Communication: Mobility status PT Visit Diagnosis: Repeated falls (R29.6);Muscle weakness (generalized) (M62.81);Difficulty in walking, not elsewhere classified (R26.2)    Time: 4619-0122 PT Time Calculation (min) (ACUTE ONLY): 36 min   Charges:   PT Evaluation $PT Eval Moderate Complexity: 1 Mod PT Treatments $Gait Training: 8-22 mins        Karma Ganja, PT, DPT   Acute Rehabilitation Department Pager #: [phone]  Otho Bellows 08/07/2020, 10:52 AM

## 2020-08-07 NOTE — Discharge Summary (Signed)
Physician Discharge Summary  Patient ID: Jose Kline MRN: [id number] DOB/AGE: [birth year] 68 y.o.  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 The patient tolerated the procedure well and was taken to the recovery room and then to the floor in stable condition. The hospital course was routine. There were no complications. The wound remained clean dry and intact. Pt had appropriate back soreness. No complaints of leg pain or new N/T/W. The patient remained afebrile with stable vital signs, and tolerated a regular diet. The patient continued to increase activities, and pain was well controlled with oral pain medications.   Consults: None  Significant Diagnostic Studies:  Results for orders placed or performed during the hospital encounter of 08/04/20  SARS CORONAVIRUS 2 (TAT 6-24 HRS) Nasopharyngeal Nasopharyngeal Swab   Specimen: Nasopharyngeal Swab  Result Value Ref Range   SARS Coronavirus 2 NEGATIVE NEGATIVE    DG Thoracic Spine 2 View  Result Date: 08/06/2020 CLINICAL DATA:  Intraoperative localization for laminectomy EXAM: THORACIC SPINE 2 VIEWS COMPARISON:  07/01/2020 FINDINGS: Surgical retractors and instruments are noted posterior to the T9-T10 level. Numbering nomenclature is similar to that utilized on prior plain film evaluation. Multilevel lumbar fusion is seen from L1-S1. IMPRESSION: Intraoperative localization at T9-10. Electronically Signed   By: [name] M.D.   On: 08/06/2020 19:44    Antibiotics:  Anti-infectives (From admission, onward)   Start     Dose/Rate Route Frequency Ordered Stop   08/07/20 0100  ceFAZolin (ANCEF) IVPB 2g/100 mL  premix        2 g 200 mL/hr over 30 Minutes Intravenous Every 8 hours 08/06/20 2101 08/09/20 0059   08/06/20 1145  ceFAZolin (ANCEF) IVPB 2g/100 mL premix        2 g 200 mL/hr over 30 Minutes Intravenous On call to O.R. 08/06/20 1136 08/06/20 1700   08/06/20 1140  ceFAZolin (ANCEF) 2-4 GM/100ML-% IVPB       Note to Pharmacy: Nyoka Cowden   : cabinet override      08/06/20 1140 08/06/20 1709      Discharge Exam: Blood pressure 104/66, pulse 73, temperature 97.8 F (36.6 C), temperature source Oral, resp. rate 19, SpO2 98 %. Neurologic: Grossly normal Baseline lower extremity weakness which is unchanged since surgery   Discharge Medications:   Allergies as of 08/07/2020      Reactions   Soap Rash   Can not tolerate scented soaps. Very sensitive to skin      Medication List    STOP taking these medications   HYDROcodone-acetaminophen 5-325 MG tablet Commonly known as: NORCO/VICODIN     TAKE these medications   BC Fast Pain Relief 845-65 MG Pack Generic drug: Aspirin-Caffeine Take 1 packet by mouth daily as needed (pain.).   gabapentin 300 MG capsule Commonly known as: NEURONTIN Take 300 mg by mouth every evening.   lisinopril-hydrochlorothiazide 20-25 MG tablet Commonly known as: ZESTORETIC Take 1 tablet by mouth daily.   lovastatin 20 MG tablet Commonly known as: MEVACOR Take 20 mg by mouth every evening.   methocarbamol 500 MG tablet Commonly known as: Robaxin Take 1 tablet (500 mg total) by mouth 4 (four) times daily.   omeprazole 40 MG capsule Commonly known as: PRILOSEC  Take 40 mg by mouth daily as needed (heartburn).   oxyCODONE-acetaminophen 5-325 MG tablet Commonly known as: Percocet Take 1 tablet by mouth every 4 (four) hours as needed for severe pain.   triamcinolone cream 0.1 % Commonly known as: KENALOG Apply 1 application topically daily as needed (rash).       Disposition: home   Final Dx: Thoracic decompression T9-T12  Discharge  Instructions     Remove dressing in 72 hours   Complete by: As directed    Call MD for:  difficulty breathing, headache or visual disturbances   Complete by: As directed    Call MD for:  hives   Complete by: As directed    Call MD for:  persistant dizziness or light-headedness   Complete by: As directed    Call MD for:  persistant nausea and vomiting   Complete by: As directed    Call MD for:  redness, tenderness, or signs of infection (pain, swelling, redness, odor or green/yellow discharge around incision site)   Complete by: As directed    Call MD for:  severe uncontrolled pain   Complete by: As directed    Diet - low sodium heart healthy   Complete by: As directed    Driving Restrictions   Complete by: As directed    No driving for 2 weeks, no riding in the car for 1 week   Face-to-face encounter (required for Medicare/Medicaid patients)   Complete by: As directed    I [name] certify that this patient is under my care and that I, or a nurse practitioner or physician's assistant working with me, had a face-to-face encounter that meets the physician face-to-face encounter requirements with this patient on 08/07/2020. The encounter with the patient was in whole, or in part for the following medical condition(s) which is the primary reason for home health care (List medical condition): postop spine surger   The encounter with the patient was in whole, or in part, for the following medical condition, which is the primary reason for home health care: postop spine surgery   I certify that, based on my findings, the following services are medically necessary home health services: Physical therapy   Reason for Medically Necessary Home Health Services: Therapy- Personnel officer, Public librarian   My clinical findings support the need for the above services: Unsafe ambulation due to balance issues   Further, I certify that my clinical findings support that this  patient is homebound due to: Unsafe ambulation due to balance issues   Home Health   Complete by: As directed    To provide the following care/treatments:  PT OT     Increase activity slowly   Complete by: As directed    Lifting restrictions   Complete by: As directed    No lifting more than 8 lbs         Signed: Ocie Cornfield Alta Goding 08/07/2020, 11:37 AM

## 2020-08-07 NOTE — TOC Progression Note (Signed)
Transition of Care [facility]) - Progression Note    Patient Details  Name: Jose Kline MRN: [id number] Date of Birth: December 15, 1950  Transition of Care [facility]) CM/SW Contact  Angelita Ingles, RN Phone Number: [phone]  08/07/2020, 4:28 PM  Clinical Narrative:   Choice offered to patient. Home health has been set up with [facility]. Start of service to begin 10/15. No further needs CM will sigh off.      Barriers to Discharge: No Barriers Identified  Expected Discharge Plan and Services           Expected Discharge Date: 08/07/20               DME Arranged: N/A         HH Arranged: PT, OT, Nurse's Aide HH Agency: [facility] Date [place]: 08/07/20 Time [place]: 1628 Representative spoke with at [place]: Amy   Social Determinants of Health ([place]) Interventions    Readmission Risk Interventions No flowsheet data found.

## 2020-08-07 NOTE — Care Management Obs Status (Signed)
[place] NOTIFICATION   Patient Details  Name: Jose Kline MRN: [id number] Date of Birth: Feb 28, 1951   Medicare Observation Status Notification Given:  Yes    Angelita Ingles, RN 08/07/2020, 12:10 PM

## 2020-08-07 NOTE — Discharge Instructions (Signed)

## 2020-08-07 NOTE — Progress Notes (Signed)
Subjective: Patient reports back doing ok, still weak and pain in both legs, does not think this has gotten worse though.   Objective: Vital signs in last 24 hours: Temp:  [97.6 F (36.4 C)-98.2 F (36.8 C)] 97.6 F (36.4 C) (10/14 0449) Pulse Rate:  [66-81] 73 (10/14 0449) Resp:  [11-20] 18 (10/14 0449) BP: (102-129)/(60-84) 106/60 (10/14 0449) SpO2:  [93 %-100 %] 98 % (10/14 0449)  Intake/Output from previous day: 10/13 0701 - 10/14 0700 In: 1100 [I.V.:1000; IV Piggyback:100] Out: 915 [Urine:685; Drains:130; Blood:100] Intake/Output this shift: No intake/output data recorded.  Neurologic: Grossly normal, lower extremity weakness that has not progressed since surgery   Lab Results: Lab Results  Component Value Date   WBC 8.9 08/04/2020   HGB 12.6 (L) 08/04/2020   HCT 37.8 (L) 08/04/2020   MCV 93.6 08/04/2020   PLT 280 08/04/2020   No results found for: INR, PROTIME BMET Lab Results  Component Value Date   NA 134 (L) 08/04/2020   K 3.1 (L) 08/04/2020   CL 99 08/04/2020   CO2 24 08/04/2020   GLUCOSE 149 (H) 08/04/2020   BUN 15 08/04/2020   CREATININE 0.95 08/04/2020   CALCIUM 9.1 08/04/2020    Studies/Results: DG Thoracic Spine 2 View  Result Date: 08/06/2020 CLINICAL DATA:  Intraoperative localization for laminectomy EXAM: THORACIC SPINE 2 VIEWS COMPARISON:  07/01/2020 FINDINGS: Surgical retractors and instruments are noted posterior to the T9-T10 level. Numbering nomenclature is similar to that utilized on prior plain film evaluation. Multilevel lumbar fusion is seen from L1-S1. IMPRESSION: Intraoperative localization at T9-10. Electronically Signed   By: [name] M.D.   On: 08/06/2020 19:44    Assessment/Plan: 69 year old male postop day 1 thoracic decompression. Will work with therapy today and see how he does.    LOS: 1 day    Jose Kline 08/07/2020, 7:50 AM

## 2021-10-05 ENCOUNTER — Ambulatory Visit
Admission: RE | Admit: 2021-10-05 | Discharge: 2021-10-05 | Disposition: A | Discharge status: Home / Self Care | Payer: Medicare Other | Source: Ambulatory Visit | Attending: Internal Medicine | Admitting: Internal Medicine

## 2021-10-05 ENCOUNTER — Other Ambulatory Visit: Payer: Self-pay | Admitting: Internal Medicine

## 2021-10-05 DIAGNOSIS — R059 Cough, unspecified: Secondary | ICD-10-CM

## 2021-11-24 ENCOUNTER — Other Ambulatory Visit: Payer: Self-pay | Admitting: Internal Medicine

## 2021-11-24 ENCOUNTER — Ambulatory Visit
Admission: RE | Admit: 2021-11-24 | Discharge: 2021-11-24 | Disposition: A | Discharge status: Home / Self Care | Payer: Medicare PPO | Source: Ambulatory Visit | Attending: Internal Medicine | Admitting: Internal Medicine

## 2021-11-24 DIAGNOSIS — J189 Pneumonia, unspecified organism: Secondary | ICD-10-CM

## 2022-03-23 ENCOUNTER — Emergency Department (HOSPITAL_COMMUNITY): Payer: Medicare PPO

## 2022-03-23 ENCOUNTER — Other Ambulatory Visit: Payer: Self-pay

## 2022-03-23 ENCOUNTER — Emergency Department (HOSPITAL_COMMUNITY)
Admission: EM | Admit: 2022-03-23 | Discharge: 2022-03-24 | Disposition: A | Discharge status: Home / Self Care | Payer: Medicare PPO | Attending: Emergency Medicine | Admitting: Emergency Medicine

## 2022-03-23 DIAGNOSIS — R55 Syncope and collapse: Secondary | ICD-10-CM | POA: Insufficient documentation

## 2022-03-23 DIAGNOSIS — R1030 Lower abdominal pain, unspecified: Secondary | ICD-10-CM | POA: Insufficient documentation

## 2022-03-23 LAB — CBC WITH DIFFERENTIAL/PLATELET
Abs Immature Granulocytes: 0.02 10*3/uL (ref 0.00–0.07)
Basophils Absolute: 0.1 10*3/uL (ref 0.0–0.1)
Basophils Relative: 1 %
Eosinophils Absolute: 0.2 10*3/uL (ref 0.0–0.5)
Eosinophils Relative: 3 %
HCT: 37.6 % — ABNORMAL LOW (ref 39.0–52.0)
Hemoglobin: 12.3 g/dL — ABNORMAL LOW (ref 13.0–17.0)
Immature Granulocytes: 0 %
Lymphocytes Relative: 22 %
Lymphs Abs: 1.6 10*3/uL (ref 0.7–4.0)
MCH: 28.7 pg (ref 26.0–34.0)
MCHC: 32.7 g/dL (ref 30.0–36.0)
MCV: 87.6 fL (ref 80.0–100.0)
Monocytes Absolute: 0.7 10*3/uL (ref 0.1–1.0)
Monocytes Relative: 9 %
Neutro Abs: 4.8 10*3/uL (ref 1.7–7.7)
Neutrophils Relative %: 65 %
Platelets: 260 10*3/uL (ref 150–400)
RBC: 4.29 MIL/uL (ref 4.22–5.81)
RDW: 17.5 % — ABNORMAL HIGH (ref 11.5–15.5)
WBC: 7.4 10*3/uL (ref 4.0–10.5)
nRBC: 0 % (ref 0.0–0.2)

## 2022-03-23 LAB — URINALYSIS, ROUTINE W REFLEX MICROSCOPIC
Bilirubin Urine: NEGATIVE
Glucose, UA: NEGATIVE mg/dL
Hgb urine dipstick: NEGATIVE
Ketones, ur: NEGATIVE mg/dL
Leukocytes,Ua: NEGATIVE
Nitrite: NEGATIVE
Protein, ur: NEGATIVE mg/dL
Specific Gravity, Urine: 1.019 (ref 1.005–1.030)
pH: 5 (ref 5.0–8.0)

## 2022-03-23 NOTE — ED Notes (Signed)
Family took pt outside for some air

## 2022-03-23 NOTE — ED Provider Triage Note (Signed)
Emergency Medicine Provider Triage Evaluation Note  Jose Kline , a 71 y.o. male  was evaluated in triage.  Pt complains of syncope that occurred this AM.  Patient reports that this morning he was smoking cigarette drinking his coffee, subsequently went to pull his right shoe on and developed a sharp pain in his right lower abdomen/groin area, he then became lightheaded/sweaty and passed out.  He reports head injury.  When he came back around the pain had subsequently resolved.  When asked about chest pain he states that several hours later he did have a very brief episode of pins and needle central chest pain that has not reoccurred.  Currently feeling tired, however has no other major complaints, he is here because his family wanted him checked out.  He says he takes [place] powder sometimes, he does not take any anticoagulation.  Review of Systems  Positive: Syncope, abdominal pain, chest pain Negative: Vomiting, dyspnea, neck pain, back pain  Physical Exam  BP 124/86   Pulse 69   Temp 98.4 F (36.9 C) (Oral)   Resp 18   SpO2 97%  Gen:   Awake, no distress   Resp:  Normal effort  MSK:   Moves extremities without difficulty  Other:  RLQ abdominal tenderness to palpation.   Medical Decision Making  Medically screening exam initiated at 10:41 PM.  Appropriate orders placed.  Jose Kline was informed that the remainder of the evaluation will be completed by another provider, this initial triage assessment does not replace that evaluation, and the importance of remaining in the ED until their evaluation is complete.  Syncope   Amaryllis Dyke, PA-C 03/24/22 0024

## 2022-03-23 NOTE — ED Notes (Signed)
Per Pt he started having abd yesterday in the RUQ that was a sharp pain, then had a sharp stabbing pain this morning in the RLQ. Pt reports intermittent chest pain on the L side x3 weeks, denies nausea/sob. Pt states he only fell once today and he doesn't remember the fall but c/o pain to L forehead.

## 2022-03-23 NOTE — ED Triage Notes (Signed)
Pt here from home via [facility] for 2 falls today. Per pt he fell twice due to abd pain Pt reports RUQ sharp stabbing pain. Pt's had syncopal episode and fell family and hit L temple. 146/86, 79HR, 96%RA

## 2022-03-24 ENCOUNTER — Emergency Department (HOSPITAL_COMMUNITY): Payer: Medicare PPO

## 2022-03-24 LAB — COMPREHENSIVE METABOLIC PANEL
ALT: 11 U/L (ref 0–44)
AST: 21 U/L (ref 15–41)
Albumin: 3.7 g/dL (ref 3.5–5.0)
Alkaline Phosphatase: 78 U/L (ref 38–126)
Anion gap: 8 (ref 5–15)
BUN: 19 mg/dL (ref 8–23)
CO2: 27 mmol/L (ref 22–32)
Calcium: 9.2 mg/dL (ref 8.9–10.3)
Chloride: 104 mmol/L (ref 98–111)
Creatinine, Ser: 1.18 mg/dL (ref 0.61–1.24)
GFR, Estimated: >60 mL/min (ref >60)
Glucose, Bld: 119 mg/dL — ABNORMAL HIGH (ref 70–99)
Potassium: 3.6 mmol/L (ref 3.5–5.1)
Sodium: 139 mmol/L (ref 135–145)
Total Bilirubin: 0.2 mg/dL — ABNORMAL LOW (ref 0.3–1.2)
Total Protein: 6.6 g/dL (ref 6.5–8.1)

## 2022-03-24 LAB — TROPONIN I (HIGH SENSITIVITY)
Troponin I (High Sensitivity): 12 ng/L (ref <18)
Troponin I (High Sensitivity): 12 ng/L (ref <18)

## 2022-03-24 LAB — LIPASE, BLOOD: Lipase: 37 U/L (ref 11–51)

## 2022-03-24 MED ORDER — IOHEXOL 300 MG/ML  SOLN
100.0000 mL | Freq: Once | INTRAMUSCULAR | Status: AC | PRN
  Administered 2022-03-24: 100 mL via INTRAVENOUS

## 2022-03-24 NOTE — ED Provider Notes (Signed)
[facility] Emergency Department Provider Note MRN:  [id number]  Arrival date & time: 03/24/22     Chief Complaint   Fall, Loss of Consciousness, Chest Pain, and Abdominal Pain   History of Present Illness   Jose Kline is a 71 y.o. year-old male presents to the ED with chief complaint of abdominal pain and syncope.  He states that he was pulling on one of his work boots today and experienced severe pain in his lower abdomen.  He states that the next thing he knew he was picking himself up off the ground.  He thinks that he had such severe pain that caused him to pass out.  He reports history of significant low back pain and nerve injury, and thinks that bending over and pulling to put on his boot exacerbated his chronic back pain.  He states that his pain has resolved and he is no longer having any symptoms now..  History provided by patient.   Review of Systems  Pertinent review of systems noted in HPI.    Physical Exam   Vitals:   03/23/22 2225 03/24/22 0045  BP: 124/86 134/77  Pulse: 69 (!) 58  Resp: 18 14  Temp: 98.4 F (36.9 C)   SpO2: 97% 97%    CONSTITUTIONAL:  well-appearing, NAD NEURO:  Alert and oriented x 3, CN 3-12 grossly intact EYES:  eyes equal and reactive ENT/NECK:  Supple, no stridor  CARDIO:  normal rate, regular rhythm, appears well-perfused  PULM:  No respiratory distress,  GI/GU:  non-distended, no focal abdominal tenderness MSK/SPINE:  No gross deformities, no edema, moves all extremities  SKIN:  no rash, atraumatic   *Additional and/or pertinent findings included in MDM below  Diagnostic and Interventional Summary    EKG Interpretation  Date/Time:  Tuesday Mar 23 2022 22:33:26 EDT Ventricular Rate:  65 PR Interval:  244 QRS Duration: 90 QT Interval:  378 QTC Calculation: 393 R Axis:   -14 Text Interpretation: Sinus rhythm with 1st degree A-V block Possible Anterior infarct , age undetermined Abnormal ECG When  compared with ECG of 23-Mar-2022 22:33, PREVIOUS ECG IS PRESENT Confirmed by Addison Lank [phone]) on 03/24/2022 1:45:17 AM       Labs Reviewed  COMPREHENSIVE METABOLIC PANEL - Abnormal; Notable for the following components:      Result Value   Glucose, Bld 119 (*)    Total Bilirubin 0.2 (*)    All other components within normal limits  CBC WITH DIFFERENTIAL/PLATELET - Abnormal; Notable for the following components:   Hemoglobin 12.3 (*)    HCT 37.6 (*)    RDW 17.5 (*)    All other components within normal limits  LIPASE, BLOOD  URINALYSIS, ROUTINE W REFLEX MICROSCOPIC  TROPONIN I (HIGH SENSITIVITY)  TROPONIN I (HIGH SENSITIVITY)    CT Head Wo Contrast  Final Result    CT ABDOMEN PELVIS W CONTRAST  Final Result    DG Chest 2 View  Final Result      Medications  iohexol (OMNIPAQUE) 300 MG/ML solution 100 mL (100 mLs Intravenous Contrast Given 03/24/22 0119)     Procedures  /  Critical Care Procedures  ED Course and Medical Decision Making  I have reviewed the triage vital signs, the nursing notes, and pertinent available records from the EMR.  Social Determinants Affecting Complexity of Care: Patient has no clinically significant social determinants affecting this chief complaint..   ED Course:   Patient here with syncope thought 2/2 pain.  Marland Kitchen  Medical Decision Making Patient here with syncopal episode following severe episode of brief abdominal pain that occurred while he was putting on his boots.  His symptoms have completely resolved.  Suspect the patient may have exacerbated his chronic low back pain while bending over and putting on his boots.  He denies any lightheadedness or dizziness now.  States he feels well.  Labs are reassuring and are as noted below.  Imaging is also reassuring.  Problems Addressed: Lower abdominal pain: acute illness or injury Syncope and collapse: acute illness or injury  Amount and/or Complexity of Data Reviewed Labs: ordered.     Details: Troponin is 12x2, no chest pain, doubt ACS or dysrhythmia.  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  No significant electrolyte derangements.  Stable and mild anemia when compared to prior labs Radiology: ordered and independent interpretation performed.    Details: No intracranial bleed, no skull fracture, no obvious free air seen on CT abdomen ECG/medicine tests: independent interpretation performed.    Details: First-degree AV block     Consultants: No consultations were needed in caring for this patient.   Treatment and Plan: I considered admission due to patient's initial presentation, but after considering the examination and diagnostic results, patient will not require admission and can be discharged with outpatient follow-up.  Patient discussed with attending physician, Dr. Leonette Monarch, who agrees with plan for discharge.  Final Clinical Impressions(s) / ED Diagnoses     ICD-10-CM   1. Lower abdominal pain  R10.30     2. Syncope and collapse  R55       ED Discharge Orders     None         Discharge Instructions Discussed with and Provided to Patient:    Discharge Instructions      You scans and labs look good.  I think the cause of your passing out is from severe pain likely from your back.  Return for new or worsening symptoms.      Montine Circle, PA-C 03/24/22 0157    Fatima Blank, MD 03/24/22 [phone]

## 2022-03-24 NOTE — Discharge Instructions (Signed)
You scans and labs look good.  I think the cause of your passing out is from severe pain likely from your back.  Return for new or worsening symptoms.

## 2022-08-20 ENCOUNTER — Telehealth: Payer: Self-pay

## 2022-08-20 NOTE — Telephone Encounter (Signed)
Marzetta Board said yes

## 2022-08-20 NOTE — Telephone Encounter (Signed)
-----   Message from Peggyann Shoals sent at 08/20/2022  3:42 PM EDT ----- Regarding: 2nd opinion Patient is asking for a 2nd opinion with our Neurosurgery.  He has had 2 past spinal surgeries with [facility]. He he is in pain everyday and trouble walking. No recent scans or conservative treatment. I have 138 pages of notes from [facility]. Can I schedule him with Marzetta Board?

## 2022-08-23 NOTE — Telephone Encounter (Signed)
Appt 09/10/2022 at 10am. Letter mailed

## 2022-08-30 ENCOUNTER — Ambulatory Visit
Admission: RE | Admit: 2022-08-30 | Discharge: 2022-08-30 | Disposition: A | Discharge status: Home / Self Care | Payer: Self-pay | Source: Ambulatory Visit | Attending: Orthopedic Surgery | Admitting: Orthopedic Surgery

## 2022-08-30 ENCOUNTER — Other Ambulatory Visit: Payer: Self-pay

## 2022-08-30 DIAGNOSIS — Z049 Encounter for examination and observation for unspecified reason: Secondary | ICD-10-CM

## 2022-09-08 NOTE — Progress Notes (Signed)
Referring Physician:  No referring provider defined for this encounter.  Primary Physician:  Lorene Dy, MD  History of Present Illness: 09/10/2022    Mr. Jose Kline is here today with a history of GERD, HTN, and hyperlipidemia.   He's had multiple spinal surgeries and has been seeing [facility].   He complains of constant bilateral leg pain  (entire leg) to his feet that started after his last surgery (Thoracic decompression T9-T12 on 08/06/20). He has intermittent LBP that is more tolerable. He has right foot drop that started around 2020. He has pain in the bottom of his feet. He notes intermittent swelling in his feet. Pain in legs is worse with standing and walking. He has to sit to get pain better. He cannot walk long enough grocery shop. No numbness or tingling.   He smokes 1 to 1 and 1/2 packs per day.   Bowel/Bladder Dysfunction: none  Conservative measures:  Physical therapy:  2021 Multimodal medical therapy including regular antiinflammatories: BC's, gabapentin, methocarbamol  Injections: has not received epidural steroid injections  Past Surgery:  - instrumented fusion L1-L2 in 1990s - Cervical fusion by Dr Sherwood Gambler - instrumented fusion L2-L4 with removal of hardware at L1 on 04/05/19 by Dr Sherwood Gambler - instrumented fusion L2-S1 on 05/14/20 by Dr Saintclair Halsted - Thoracic decompression T9-T12 on 08/06/20 by Dr Livingston Diones has some balance issues but no other symptoms of cervical myelopathy.  The symptoms are causing a significant impact on the patient's life.   Review of Systems:  A 10 point review of systems is negative, except for the pertinent positives and negatives detailed in the HPI.  Past Medical History: Past Medical History:  Diagnosis Date   Anxiety    after wife passed away   Complication of anesthesia    constipation   DDD (degenerative disc disease), lumbosacral    GERD (gastroesophageal reflux disease)    Headache     migraines that cause him to throw up but none recently   Hyperlipidemia    Hypertension     Past Surgical History: Past Surgical History:  Procedure Laterality Date   DECOMPRESSIVE LUMBAR LAMINECTOMY LEVEL 3  08/06/2020   Procedure: THORACIC DECOMPRESSION LEVELS T9-T12;  Surgeon: Kary Kos, MD;  Location: [place];  Service: Neurosurgery;;   EYE SURGERY Bilateral    cataract extraction with implants   HERNIA REPAIR     umbilical   INGUINAL HERNIA REPAIR Right 12/29/2017   Procedure: HERNIA REPAIR INGUINAL ADULT;  Surgeon: Herbert Pun, MD;  Location: [facility];  Service: General;  Laterality: Right;   INSERTION OF MESH Right 12/29/2017   Procedure: INSERTION OF MESH;  Surgeon: Herbert Pun, MD;  Location: [facility];  Service: General;  Laterality: Right;   IR GENERIC HISTORICAL  12/25/2014   IR RADIOLOGIST EVAL & MGMT 12/25/2014 Greggory Keen, MD [facility] INTERV RAD   LUMBAR LAMINECTOMY/DECOMPRESSION MICRODISCECTOMY  08/06/2020   Procedure: THORACIC LAMINECTOMY/DECOMPRESSION MICRODISCECTOMY LEVEL T11-T12;  Surgeon: Kary Kos, MD;  Location: [place];  Service: Neurosurgery;;   [place]  04/2011   1990's was first back surgery.  2 rods in middle of back. Disc degenerative disease   THORACIC DISCECTOMY N/A 08/06/2020   Procedure: Laminectomy and Foraminotomy - Thoracic nine-ten, Thoracic ten-eleven, Thoracic eleven-twelve;  Surgeon: Kary Kos, MD;  Location: [place];  Service: Neurosurgery;  Laterality: N/A;   TONSILLECTOMY AND ADENOIDECTOMY     as a child    Allergies: Allergies as of 09/10/2022 - Review Complete  09/10/2022  Allergen Reaction Noted   Soap Rash 12/22/2017    Medications: Outpatient Encounter Medications as of 09/10/2022  Medication Sig   Aspirin-Caffeine (BC FAST PAIN RELIEF) 845-65 MG PACK Take 1 packet by mouth daily as needed (pain.).   gabapentin (NEURONTIN) 300 MG capsule Take 300 mg by mouth every evening.   lisinopril-hydrochlorothiazide  (ZESTORETIC) 20-12.5 MG tablet Take 1 tablet by mouth daily.   lovastatin (MEVACOR) 20 MG tablet Take 20 mg by mouth every evening.    omeprazole (PRILOSEC) 40 MG capsule Take 40 mg by mouth daily as needed (heartburn).    triamcinolone cream (KENALOG) 0.1 % Apply 1 application topically daily as needed (rash).    [DISCONTINUED] methocarbamol (ROBAXIN) 500 MG tablet Take 1 tablet (500 mg total) by mouth 4 (four) times daily. (Patient not taking: Reported on 03/24/2022)   No facility-administered encounter medications on file as of 09/10/2022.    Social History: Social History   Tobacco Use   Smoking status: Every Day    Packs/day: 1.50    Years: 50.00    Total pack years: 75.00    Types: Cigarettes    Start date: 12/25/1966   Smokeless tobacco: Current    Types: Chew  Vaping Use   Vaping Use: Never used  Substance Use Topics   Alcohol use: Yes    Alcohol/week: 0.0 standard drinks of alcohol    Comment: rare and occasionally before bed   Drug use: No    Family Medical History: Family History  Problem Relation Age of Onset   Cancer Mother        breast   Kidney disease Mother     Physical Examination: Vitals:   09/10/22 1011  BP: 135/84  Pulse: 64    General: Patient is well developed, well nourished, calm, collected, and in no apparent distress. Attention to examination is appropriate.  Respiratory: Patient is breathing without any difficulty.   NEUROLOGICAL:     Awake, alert, oriented to person, place, and time.  Speech is clear and fluent. Fund of knowledge is appropriate.   Cranial Nerves: Pupils equal round and reactive to light.  Facial tone is symmetric.  Facial sensation is symmetric.  TL incisions are well healed. Mild mid to lower thoracic tenderness and mild lower lumbar tenderness noted.   No abnormal lesions on exposed skin.   Strength: Side Biceps Triceps Deltoid Interossei Grip Wrist Ext. Wrist Flex.  R '5 5 5 5 5 5 5  '$ L '5 5 5 5 5 5 5   '$ Side  Iliopsoas Quads Hamstring PF DF EHL  R '5 5 5 5 3 3  '$ L '5 5 5 5 5 5   '$ Reflexes are 2+ and symmetric at the biceps, triceps, brachioradialis, patella and achilles.   Hoffman's is absent.  Clonus is not present.   Bilateral upper and lower extremity sensation is intact to light touch.     He walks hunched over- has significant sagittal imbalance. Foot drop noted on right.   Medical Decision Making  Imaging: No recent imaging to review.   I have personally reviewed the images and agree with the above interpretation.  Assessment and Plan: Mr. Lafitte is a pleasant 71 y.o. male with history of multiple spinal surgeries and he has been seeing [facility].   His primary complaint is constant bilateral leg pain  (entire leg) to his feet that started after his last surgery (Thoracic decompression T9-T12 on 08/06/20). He has intermittent LBP that is more tolerable.  He has right foot drop that started around 2020. Pain in legs is worse with standing and walking.   No recent imaging done. He has significant sagittal imbalance on exam. He has right foot drop as well.   Treatment options discussed with patient and following plan made:   - MRI of thoracic and lumbar spine for further evaluation of continued pain s/p surgery.  - Full length scoliosis xrays.  - Will review above imaging with Dr. Izora Ribas and make him f/u to see me in clinic.  - He may be candidate for SCS?   I spent a total of 30 minutes in face-to-face and non-face-to-face activities related to this patient's care toda including review of outside records, review of imaging, review of symptoms, physical exam, discussion of differential diagnosis, discussion of treatment options, and documentation.   Thank you for involving me in the care of this patient.   Geronimo Boot PA-C Dept. of Neurosurgery

## 2022-09-10 ENCOUNTER — Ambulatory Visit: Payer: Medicare PPO | Admitting: Orthopedic Surgery

## 2022-09-10 ENCOUNTER — Encounter: Payer: Self-pay | Admitting: Orthopedic Surgery

## 2022-09-10 VITALS — BP 135/84 | HR 64 | Ht 66.0 in | Wt 152.8 lb

## 2022-09-10 DIAGNOSIS — M21371 Foot drop, right foot: Secondary | ICD-10-CM

## 2022-09-10 DIAGNOSIS — M546 Pain in thoracic spine: Secondary | ICD-10-CM | POA: Diagnosis not present

## 2022-09-10 DIAGNOSIS — Z981 Arthrodesis status: Secondary | ICD-10-CM | POA: Diagnosis not present

## 2022-09-10 DIAGNOSIS — M5416 Radiculopathy, lumbar region: Secondary | ICD-10-CM

## 2022-09-10 NOTE — Patient Instructions (Signed)
It was so nice to see you today, I am sorry that you are hurting so much.   I want to get an MRI of your mid and lower back to look into things further. We will call you to set this up. I also want to get full length xrays of your spine. Can do this when you get the MRIs done.   I will see you back in after the tests are done. Please do not hesitate to call if you have any questions or concerns. You can also message me in [place].   If you have not heard back about any of the tests/procedures in the next week, please call the office so we can help you get these things scheduled.   Geronimo Boot PA-C [phone]

## 2022-10-03 ENCOUNTER — Ambulatory Visit
Admission: RE | Admit: 2022-10-03 | Discharge: 2022-10-03 | Disposition: A | Discharge status: Home / Self Care | Payer: Medicare PPO | Source: Ambulatory Visit | Attending: Orthopedic Surgery | Admitting: Orthopedic Surgery

## 2022-10-03 DIAGNOSIS — Z981 Arthrodesis status: Secondary | ICD-10-CM | POA: Diagnosis present

## 2022-10-03 DIAGNOSIS — M546 Pain in thoracic spine: Secondary | ICD-10-CM

## 2022-10-03 DIAGNOSIS — M5416 Radiculopathy, lumbar region: Secondary | ICD-10-CM | POA: Diagnosis present

## 2022-10-06 ENCOUNTER — Encounter: Payer: Self-pay | Admitting: Orthopedic Surgery

## 2022-10-06 NOTE — Progress Notes (Signed)
Reviewed MRI of thoracic/lumbar spine and scoliosis xrays with Dr. Izora Ribas.   Not a candidate for SCS due to degenerative changes in thoracic spine.   Recommend he see Dr. Holley Raring to consider peripheral nerve stimulator.   Surgery option would include a TL fusion with PSOs. This is not recommended especially since he does not have significant back pain.   Will make him a follow up to discuss.

## 2022-10-07 ENCOUNTER — Encounter: Payer: Self-pay | Admitting: Orthopedic Surgery

## 2022-10-07 ENCOUNTER — Ambulatory Visit: Payer: Medicare PPO | Admitting: Orthopedic Surgery

## 2022-10-07 VITALS — BP 132/80 | Ht 66.0 in | Wt 152.0 lb

## 2022-10-07 DIAGNOSIS — M5416 Radiculopathy, lumbar region: Secondary | ICD-10-CM

## 2022-10-07 DIAGNOSIS — M438X9 Other specified deforming dorsopathies, site unspecified: Secondary | ICD-10-CM

## 2022-10-07 DIAGNOSIS — M21371 Foot drop, right foot: Secondary | ICD-10-CM

## 2022-10-07 DIAGNOSIS — Z981 Arthrodesis status: Secondary | ICD-10-CM

## 2022-10-07 DIAGNOSIS — M4030 Flatback syndrome, site unspecified: Secondary | ICD-10-CM

## 2022-10-07 NOTE — Progress Notes (Signed)
Referring Physician:  No referring provider defined for this encounter.  Primary Physician:  Lorene Dy, MD  History of Present Illness:  Mr. Jose Kline is here today with a history of GERD, HTN, and hyperlipidemia.   He's had multiple spinal surgeries and has been seeing [facility].   Last seen by me on 09/10/22 for second opinion. His primary complaint was constant bilateral leg pain  (entire leg) to his feet that started after his last surgery (Thoracic decompression T9-T12 on 08/06/20). He stated that his intermittent LBP was more tolerable. He has right foot drop that started around 2020.   MRI thoracic/lumbar spine and scoliosis xrays were ordered at last visit. He is here to review them.   Primary complaint is constant bilateral leg pain  (entire leg) to his feet that started after his last surgery (Thoracic decompression T9-T12 on 08/06/20). He has intermittent LBP that can be severe at times. He has right foot drop that started around 2020. Pain in legs is worse with standing and walking. If he stops to sit, pain gets better. No numbness or tingling.   He smokes 1 to 1 and 1/2 packs per day.   Bowel/Bladder Dysfunction: none  Conservative measures:  Physical therapy:  2021 Multimodal medical therapy including regular antiinflammatories: BC's, gabapentin, methocarbamol  Injections: has not received epidural steroid injections  Past Surgery:  - instrumented fusion L1-L2 in 1990s - Cervical fusion by Dr Sherwood Gambler - instrumented fusion L2-L4 with removal of hardware at L1 on 04/05/19 by Dr Sherwood Gambler - instrumented fusion L2-S1 on 05/14/20 by Dr Saintclair Halsted - Thoracic decompression T9-T12 on 08/06/20 by Dr Livingston Diones has some balance issues but no other symptoms of cervical myelopathy.  The symptoms are causing a significant impact on the patient's life.   Review of Systems:  A 10 point review of systems is negative, except for the pertinent positives  and negatives detailed in the HPI.  Past Medical History: Past Medical History:  Diagnosis Date   Anxiety    after wife passed away   Complication of anesthesia    constipation   DDD (degenerative disc disease), lumbosacral    GERD (gastroesophageal reflux disease)    Headache    migraines that cause him to throw up but none recently   Hyperlipidemia    Hypertension     Past Surgical History: Past Surgical History:  Procedure Laterality Date   DECOMPRESSIVE LUMBAR LAMINECTOMY LEVEL 3  08/06/2020   Procedure: THORACIC DECOMPRESSION LEVELS T9-T12;  Surgeon: Kary Kos, MD;  Location: [facility];  Service: Neurosurgery;;   EYE SURGERY Bilateral    cataract extraction with implants   HERNIA REPAIR     umbilical   INGUINAL HERNIA REPAIR Right 12/29/2017   Procedure: HERNIA REPAIR INGUINAL ADULT;  Surgeon: Herbert Pun, MD;  Location: [facility];  Service: General;  Laterality: Right;   INSERTION OF MESH Right 12/29/2017   Procedure: INSERTION OF MESH;  Surgeon: Herbert Pun, MD;  Location: [facility];  Service: General;  Laterality: Right;   IR GENERIC HISTORICAL  12/25/2014   IR RADIOLOGIST EVAL & MGMT 12/25/2014 Greggory Keen, MD [facility] INTERV RAD   LUMBAR LAMINECTOMY/DECOMPRESSION MICRODISCECTOMY  08/06/2020   Procedure: THORACIC LAMINECTOMY/DECOMPRESSION MICRODISCECTOMY LEVEL T11-T12;  Surgeon: Kary Kos, MD;  Location: [facility];  Service: Neurosurgery;;   [place]  04/2011   1990's was first back surgery.  2 rods in middle of back. Disc degenerative disease   THORACIC DISCECTOMY N/A 08/06/2020  Procedure: Laminectomy and Foraminotomy - Thoracic nine-ten, Thoracic ten-eleven, Thoracic eleven-twelve;  Surgeon: Kary Kos, MD;  Location: [facility];  Service: Neurosurgery;  Laterality: N/A;   TONSILLECTOMY AND ADENOIDECTOMY     as a child    Allergies: Allergies as of 10/07/2022 - Review Complete 10/07/2022  Allergen Reaction Noted   Soap Rash 12/22/2017     Medications: Outpatient Encounter Medications as of 10/07/2022  Medication Sig   Aspirin-Caffeine (BC FAST PAIN RELIEF) 845-65 MG PACK Take 1 packet by mouth daily as needed (pain.).   gabapentin (NEURONTIN) 300 MG capsule Take 300 mg by mouth every evening.   lisinopril-hydrochlorothiazide (ZESTORETIC) 20-12.5 MG tablet Take 1 tablet by mouth daily.   lovastatin (MEVACOR) 20 MG tablet Take 20 mg by mouth every evening.    omeprazole (PRILOSEC) 40 MG capsule Take 40 mg by mouth daily as needed (heartburn).    triamcinolone cream (KENALOG) 0.1 % Apply 1 application topically daily as needed (rash).    No facility-administered encounter medications on file as of 10/07/2022.    Social History: Social History   Tobacco Use   Smoking status: Every Day    Packs/day: 1.50    Years: 50.00    Total pack years: 75.00    Types: Cigarettes    Start date: 12/25/1966   Smokeless tobacco: Current    Types: Chew  Vaping Use   Vaping Use: Never used  Substance Use Topics   Alcohol use: Yes    Alcohol/week: 0.0 standard drinks of alcohol    Comment: rare and occasionally before bed   Drug use: No    Family Medical History: Family History  Problem Relation Age of Onset   Cancer Mother        breast   Kidney disease Mother     Physical Examination: Vitals:   10/07/22 1257  BP: 132/80      Awake, alert, oriented to person, place, and time.  Speech is clear and fluent. Fund of knowledge is appropriate.   Cranial Nerves: Pupils equal round and reactive to light.  Facial tone is symmetric.    No abnormal lesions on exposed skin.   Strength: Side Biceps Triceps Deltoid Interossei Grip Wrist Ext. Wrist Flex.  R '5 5 5 5 5 5 5  '$ L '5 5 5 5 5 5 5   '$ Side Iliopsoas Quads Hamstring PF DF EHL  R '5 5 5 5 3 3  '$ L '5 5 5 5 5 5   '$ Reflexes are 2+ and symmetric at the biceps, triceps, brachioradialis, patella and achilles.   Hoffman's is absent.  Clonus is not present.   Bilateral upper and  lower extremity sensation is intact to light touch.     He walks hunched over- has significant sagittal imbalance. Foot drop noted on right.   Medical Decision Making  Imaging: Thoracic and Lumbar MRI dated 10/03/22:  FINDINGS: MRI THORACIC SPINE FINDINGS   Alignment:  Stable and near anatomic.   Vertebrae: Postsurgical changes in the lower cervical spine. No evidence of acute thoracic fracture or discitis. There are progressive chronic endplate degenerative changes in the lower thoracic spine status post interval posterior decompression from T9-10 through T11-12. No endplate destruction or discal hyperintensity to suggest discitis.   Cord: No acute cord abnormality identified. Possible chronic myelomalacia in the lower thoracic cord.   Paraspinal and other soft tissues: No acute paraspinal findings.   Disc levels:   Mild disc bulging and scattered small disc protrusions in the upper to  midthoracic spine without cord deformity or significant foraminal narrowing.   T7-8: Moderate-sized left paracentral disc protrusion with mild mass effect on the cord is unchanged.   T8-9: Chronic spondylosis with posterior osteophytes covering diffusely bulging disc material. The CSF surrounding the cord is effaced with mild cord flattening and possible mild cord hyperintensity. Chronic foraminal narrowing bilaterally.   T9-10: Chronic spondylosis with posterior osteophytes covering diffusely bulging disc material. Interval posterior decompression with improved patency of the spinal canal which remains moderately narrowed transversely. The CSF surrounding the cord is effaced with mild cord flattening and possible mild cord hyperintensity. Chronic foraminal narrowing bilaterally.   T10-11: Chronic spondylosis with posterior osteophytes covering diffusely bulging disc material. Interval posterior decompression with improved patency of the spinal canal which remains moderately narrowed  transversely. Effacement of the CSF surrounding the cord without cord deformity. Chronic foraminal narrowing bilaterally.   T11-12: Interval posterior decompression with improved patency of the spinal canal. No cord deformity. Bilateral facet hypertrophy with moderate to severe foraminal narrowing bilaterally.   MRI LUMBAR SPINE FINDINGS   Segmentation:  There are 5 lumbar type vertebral bodies.   Alignment: Straightening without significant angulation or listhesis.   Vertebrae: Status post lumbar fusion from L1 through S1. Remote anterior interbody fusion from L1 through L4 appears solid. More recent extension of the fusion across the L4-5 and L5-S1 disc spaces. No evidence of acute fracture or hardware displacement.   Conus medullaris and cauda equina: Conus extends to the L1-2 level. Conus and cauda equina appear normal.   Paraspinal and other soft tissues: Unremarkable.   Disc levels:   T12-L1: Progressive loss of disc height with disc bulging and a broad-based disc protrusion in the right subarticular zone. Resulting mild mass effect on the distal thoracic cord with asymmetric mild narrowing of the right lateral recess and right foramen.   The spinal canal and neural foramina remain patent at the remotely fused L1-2, L2-3 and L3-4 levels.   L4-5: The spinal canal and neural foramina are widely patent post laminectomy and interbody fusion. No significant residual fluid collection within the laminectomy bed.   L5-S1: Probable residual posterior osteophytes asymmetric to the right. The spinal canal and lateral recesses appear decompressed. Suspected residual right-greater-than-left foraminal narrowing, partly obscured by hardware artifact.   IMPRESSION: 1. Compared with previous MRI of the thoracic spine from 07/01/2020, there has been interval posterior decompression from T9-10 through T11-12 with improved patency of the spinal canal. There is residual effacement of  the CSF surrounding the cord at the operative levels with possible mild cord hyperintensity which could reflect edema or myelomalacia. 2. Stable left paracentral disc protrusion at T7-8 with mild mass effect on the cord. 3. 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 4. No acute findings identified in the lumbar spine. 5. Previous multilevel lumbar fusion with more recent extension across the L4-5 and L5-S1 disc spaces. There is good patency of the spinal canal throughout the lumbar spine with suspected residual foraminal narrowing bilaterally at L5-S1. No significant residual paraspinal fluid collections are seen.     Electronically Signed   By: Richardean Sale M.D.   On: 10/04/2022 16:18   Full length scoliosis xrays dated 10/03/22:  FINDINGS: Mild to moderate spondylosis throughout the cervical, thoracic and lumbar spine.   Mild curvature of the thoracic spine convex  right with angle of curvature from the superior endplate of T6 to the inferior endplate of G83 measuring 15.7 degrees. Multilevel disc space narrowing throughout the thoracic spine most prominent over the lower thoracic spine. No compression fracture or subluxation.   No significant curvature of the lumbar spine. Posterior fusion hardware with bilateral pedicle screws from L2-S1 intact and unchanged. Interbody fusion from the L2-3 level to the L5-S1 level unchanged. Interbody fusion at the L1-2 level unchanged. Remainder of the exam is unchanged.   IMPRESSION: Mild to moderate spondylosis throughout the cervical, thoracic and lumbar spine. Mild curvature of the thoracic spine convex right with angle of curvature from the superior endplate of T6 to the inferior endplate of M62 measuring 15.7 degrees. Posterior fusion  hardware over the lumbar spine from L2-S1 intact and unchanged.     Electronically Signed   By: Marin Olp M.D.   On: 10/03/2022 11:07  I have personally reviewed the images and agree with the above interpretation.  Above imaging reviewed with Dr. Izora Ribas prior to his visit. He has significant sagittal imbalance on scoliosis xrays.   Assessment and Plan: Mr. Castrogiovanni is a pleasant 71 y.o. male with history of multiple spinal surgeries.  Primary complaint is constant bilateral leg pain  (entire leg) to his feet that started after his last surgery (Thoracic decompression T9-T12 on 08/06/20). He has intermittent LBP that can be severe at times. He has right foot drop that started around 2020.   Updated imaging as above. 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 This is likely his primary pain generator.   Treatment options discussed with Dr. Izora Ribas prior to his appointment. Following plan made with patient:   - Surgery option would include TL fusion with PSOs to address sagittal imbalance. This is not recommended as he currently has back pain that is tolerable.  - Dr. Izora Ribas does not recommend any smaller surgery.  - He would also need to quit smoking prior to any fusion surgery.  - If he decides he would like to consider this, we can refer him to [facility].   - He is not a candidate for thoracic spinal cord stimulator due to degree of thoracic spondylosis.   - He may be candidate for peripheral nerve stimulator. Can refer to Dr. Holley Raring if he would like to proceed.   - I wrote out all the options for him. He will think about it and let me know. Will call him in mid January to touch base.   I spent a total of 30 minutes in face-to-face and non-face-to-face activities related to this patient's care today including review of outside records, review of imaging, review of symptoms, physical exam, discussion of differential diagnosis, discussion of  treatment options, and documentation.   Thank you for involving me in the care of this patient.   Geronimo Boot PA-C Dept. of Neurosurgery

## 2022-10-07 NOTE — Patient Instructions (Signed)
It was good to see you again. I'm sorry that you are still hurting so much.   I reviewed your imaging with Dr. Izora Ribas.   We discussed the following options today:   A surgery option would be a very large spinal fusion from your upper mid back to your pelvis. This is a major surgery and Dr. Izora Ribas does not do this surgery at [facility]. We would refer [facility] to [facility] for this. We do not recommend this surgery as it likely would make your back pain worse.  There is not a smaller surgery that Dr. Izora Ribas recommends.  You cannot try a spinal cord stimulator in your mid back as you have too much arthritis (wear and tear).  You may be a candidate for a peripheral nerve stimulator. This would go in your leg and help retrain your nerves/brain in the way they respond to pain. If you want to look into this further, then I can have you see Dr. Holley Raring at [facility] (he does these stimulators and is a pain management doctor).   We will call you in January to check on you. Please call me if you have any questions or concerns.   Marzetta Board  [phone]

## 2022-11-02 ENCOUNTER — Telehealth: Payer: Self-pay

## 2022-11-02 DIAGNOSIS — M4030 Flatback syndrome, site unspecified: Secondary | ICD-10-CM

## 2022-11-02 DIAGNOSIS — M5416 Radiculopathy, lumbar region: Secondary | ICD-10-CM

## 2022-11-02 DIAGNOSIS — Z981 Arthrodesis status: Secondary | ICD-10-CM

## 2022-11-02 NOTE — Telephone Encounter (Signed)
-----   Message from Peggyann Shoals sent at 11/02/2022 10:57 AM EST ----- Regarding: referral Contact: [phone] Patient seen Marzetta Board and they discussed a referral to another neurosurgeon. He has thought about and would like a consultation. Can you please place a referral.

## 2022-11-02 NOTE — Telephone Encounter (Signed)
Referral placed for pain management Milana Huntsman) for evaluation of peripheral nerve stimulator.

## 2022-11-10 ENCOUNTER — Telehealth: Payer: Self-pay | Admitting: Orthopedic Surgery

## 2022-11-10 NOTE — Telephone Encounter (Signed)
We discussed him seeing [facility] at last visit for possible peripheral nerve stimulator.   He called last week for referral. I don't see that appt has been scheduled. Please make sure referral was sent.   Thanks.

## 2022-11-10 NOTE — Telephone Encounter (Addendum)
Created in error

## 2022-11-10 NOTE — Telephone Encounter (Signed)
Created in error

## 2023-03-08 ENCOUNTER — Encounter: Payer: Self-pay | Admitting: Student in an Organized Health Care Education/Training Program

## 2023-03-08 ENCOUNTER — Ambulatory Visit
Payer: Medicare Other | Attending: Student in an Organized Health Care Education/Training Program | Admitting: Student in an Organized Health Care Education/Training Program

## 2023-03-08 VITALS — BP 129/80 | HR 68 | Temp 98.4°F | Resp 16 | Ht 66.0 in | Wt 156.0 lb

## 2023-03-08 DIAGNOSIS — M48062 Spinal stenosis, lumbar region with neurogenic claudication: Secondary | ICD-10-CM | POA: Insufficient documentation

## 2023-03-08 DIAGNOSIS — M5136 Other intervertebral disc degeneration, lumbar region: Secondary | ICD-10-CM | POA: Diagnosis present

## 2023-03-08 DIAGNOSIS — Z981 Arthrodesis status: Secondary | ICD-10-CM | POA: Diagnosis present

## 2023-03-08 DIAGNOSIS — M5416 Radiculopathy, lumbar region: Secondary | ICD-10-CM | POA: Insufficient documentation

## 2023-03-08 DIAGNOSIS — G894 Chronic pain syndrome: Secondary | ICD-10-CM | POA: Insufficient documentation

## 2023-03-08 DIAGNOSIS — G8929 Other chronic pain: Secondary | ICD-10-CM | POA: Diagnosis present

## 2023-03-08 MED ORDER — GABAPENTIN 300 MG PO CAPS: 300.0000 mg | ORAL_CAPSULE | Freq: Every evening | ORAL | 1 refills | Status: AC

## 2023-03-08 NOTE — Patient Instructions (Signed)
______________________________________________________________________  Preparing for your procedure  Appointments: If you think you may not be able to keep your appointment, call 24-48 hours in advance to cancel. We need time to make it available to others.  During your procedure appointment there will be: No Prescription Refills. No disability issues to discussed. No medication changes or discussions.  Instructions: Food intake: Avoid eating anything solid for at least 8 hours prior to your procedure. Clear liquid intake: You may take clear liquids such as water up to 2 hours prior to your procedure. (No carbonated drinks. No soda.) Transportation: Unless otherwise stated by your physician, bring a driver. Morning Medicines: Except for blood thinners, take all of your other morning medications with a sip of water. Make sure to take your heart and blood pressure medicines. If your blood pressure's lower number is above 100, the case will be rescheduled. Blood thinners: Make sure to stop your blood thinners as instructed.  If you take a blood thinner, but were not instructed to stop it, call our office [phone] and ask to talk to a nurse. Not stopping a blood thinner prior to certain procedures could lead to serious complications. Diabetics on insulin: Notify the staff so that you can be scheduled 1st case in the morning. If your diabetes requires high dose insulin, take only  of your normal insulin dose the morning of the procedure and notify the staff that you have done so. Preventing infections: Shower with an antibacterial soap the morning of your procedure.  Build-up your immune system: Take 1000 mg of Vitamin C with every meal (3 times a day) the day prior to your procedure. Antibiotics: Inform the nursing staff if you are taking any antibiotics or if you have any conditions that may require antibiotics prior to procedures. (Example: recent joint implants)   Pregnancy: If you are  pregnant make sure to notify the nursing staff. Not doing so may result in injury to the fetus, including death.  Sickness: If you have a cold, fever, or any active infections, call and cancel or reschedule your procedure. Receiving steroids while having an infection may result in complications. Arrival: You must be in the facility at least 30 minutes prior to your scheduled procedure. Tardiness: Your scheduled time is also the cutoff time. If you do not arrive at least 15 minutes prior to your procedure, you will be rescheduled.  Children: Do not bring any children with you. Make arrangements to keep them home. Dress appropriately: There is always a possibility that your clothing may get soiled. Avoid long dresses. Valuables: Do not bring any jewelry or valuables.  Reasons to call and reschedule or cancel your procedure: (Following these recommendations will minimize the risk of a serious complication.) Surgeries: Avoid having procedures within 2 weeks of any surgery. (Avoid for 2 weeks before or after any surgery). Flu Shots: Avoid having procedures within 2 weeks of a flu shots or . (Avoid for 2 weeks before or after immunizations). Barium: Avoid having a procedure within 7-10 days after having had a radiological study involving the use of radiological contrast. (Myelograms, Barium swallow or enema study). Heart attacks: Avoid any elective procedures or surgeries for the initial 6 months after a "Myocardial Infarction" (Heart Attack). Blood thinners: It is imperative that you stop these medications before procedures. Let us know if you if you take any blood thinner.  Infection: Avoid procedures during or within two weeks of an infection (including chest colds or gastrointestinal problems). Symptoms associated with infections   include: Localized redness, fever, chills, night sweats or profuse sweating, burning sensation when voiding, cough, congestion, stuffiness, runny nose, sore throat, diarrhea,  nausea, vomiting, cold or Flu symptoms, recent or current infections. It is specially important if the infection is over the area that we intend to treat. Heart and lung problems: Symptoms that may suggest an active cardiopulmonary problem include: cough, chest pain, breathing difficulties or shortness of breath, dizziness, ankle swelling, uncontrolled high or unusually low blood pressure, and/or palpitations. If you are experiencing any of these symptoms, cancel your procedure and contact your primary care physician for an evaluation.  Remember:  Regular Business hours are:  Monday to Thursday 8:00 AM to 4:00 PM  Provider's Schedule: Milinda Pointer, MD:  Procedure days: Tuesday and Thursday 7:30 AM to 4:00 PM  Gillis Santa, MD:  Procedure days: Monday and Wednesday 7:30 AM to 4:00 PM  ______________________________________________________________________  Epidural Steroid Injection  An epidural steroid injection is a shot of steroid medicine, also called cortisone, and a numbing medicine that is given into the epidural space. This space is between the spinal cord and the bones of the back. This shot helps relieve pain caused by an irritated or swollen nerve root. The pain relief you get from the injection depends on the cause of your condition and how long your pain lasts. You may have a period of slightly more pain after your injection, before the steroid medicine takes effect. This medicine usually starts working within 1-3 days. In some cases, you might need 7-10 days to feel the full effect. Tell your health care provider about: Any allergies you have. All medicines you are taking, including vitamins, herbs, eye drops, creams, and over-the-counter medicines. Any problems you or family members have had with anesthesia. Any bleeding problems you have. Any surgeries you have had. Any medical conditions you have. Whether you are pregnant or may be pregnant. What are the risks? Your  health care provider will talk with you about risks. These may include: Headache. Bleeding. Infection. Allergic reaction to medicines or dyes. Nerve damage. Not being able to move (paralysis). This is rare. What happens before the procedure? Medicines You may be given medicines to lower anxiety. Ask your provider about: Changing or stopping your regular medicines. These include any diabetes medicines or blood thinners you take. Taking medicines such as aspirin and ibuprofen. These medicines can thin your blood. Do not take them unless your provider tells you to. Taking over-the-counter medicines, vitamins, herbs, and supplements. General instructions Follow instructions from your provider about what you may eat and drink. Ask your provider what steps will be taken to help prevent infection. If you will be going home right after the procedure, plan to have a responsible adult: Take you home from the hospital or clinic. You will not be allowed to drive. Care for you for the time you are told. What happens during the procedure?  An IV will be inserted into one of your veins. You may be given a sedative to help you relax. You will be asked to sit or lie on your side. The injection site will be cleaned. An X-ray machine will be used to guide the needle close to the nerve that is causing pain. A needle will be put through your skin into the epidural space. This may cause you some discomfort. Contrast dye may be injected at the site to make sure that the steroid medicine will be sent to the exact place it needs to go. The steroid  medicine and a numbing medicine will be injected into the epidural space for pain relief. The needle will be removed. A bandage (dressing) will be put over the injection site. The procedure may vary among providers and hospitals. What happens after the procedure? Your blood pressure, heart rate, breathing rate, and blood oxygen level will be monitored until you  leave the hospital or clinic. Your IV will be removed. Your arm or leg may feel weak or numb for a few hours. This information is not intended to replace advice given to you by your health care provider. Make sure you discuss any questions you have with your health care provider. Document Revised: 05/21/2022 Document Reviewed: 05/21/2022 Elsevier Patient Education  2023 [employer].

## 2023-03-08 NOTE — Progress Notes (Signed)
Skin no acute issues regular regular I would just have the pump settings on their pulse settings patient: Jose Kline  Service Category: E/M  Provider: Edward Jolly, MD  DOB: December 29, 1950  DOS: 03/08/2023  Referring Provider: Gardiner Rhyme  MRN: [id number]  Setting: Ambulatory outpatient  PCP: Burton Apley, MD  Type: New Patient  Specialty: Interventional Pain Management    Location: Office  Delivery: Face-to-face     Primary Reason(s) for Visit: Encounter for initial evaluation of one or more chronic problems (new to examiner) potentially causing chronic pain, and posing a threat to normal musculoskeletal function. (Level of risk: High) CC: Leg Pain (ABOVE KNEE BILATERAL) and Back Pain (LOWER- 2 SURGERIES)  HPI  Jose Kline is a 72 y.o. year old, male patient, who comes for the first time to our practice referred by Drake Leach, PA-C for our initial evaluation of his chronic pain. He has Spinal stenosis, lumbar region, with neurogenic claudication; Spondylolisthesis at L4-L5 level; HNP (herniated nucleus pulposus with myelopathy), thoracic; History of lumbar spinal fusion; Lumbar radiculopathy; Lumbar degenerative disc disease; and Chronic pain syndrome on their problem list. Today he comes in for evaluation of his Leg Pain (ABOVE KNEE BILATERAL) and Back Pain (LOWER- 2 SURGERIES)  Pain Assessment: Location: Right, Left Knee Radiating: DOWN LEG TO FEET. EFFECTS ALL TOES Onset: More than a month ago Duration: Chronic pain Quality: Aching, Constant (STINGING) Severity: 8 /10 (subjective, self-reported pain score)  Effect on ADL: adl'S. "i PUSH THROUGHT THINGS", STANDING Timing: Constant Modifying factors: HEAT, REST, MEDS BP: 129/80  HR: 68  Onset and Duration: Gradual Cause of pain:  gradual after surgery Severity: No change since onset, NAS-11 at its worse: 10/10, NAS-11 at its best: 4/10, NAS-11 now: 8/10, and NAS-11 on the average: 7/10 Timing: Not influenced by the time of the  day Aggravating Factors: Prolonged standing and Walking Alleviating Factors: Cold packs and Medications Associated Problems: Depression, Fatigue, and Tingling Quality of Pain: Constant Previous Examinations or Tests: Ct-Myelogram, MRI scan, and Neurosurgical evaluation Previous Treatments: Physical Therapy, Steroid treatments by mouth, and Stretching exercises  Jose Kline is being evaluated for possible interventional pain management therapies for the treatment of his chronic pain.   Jose Kline is a pleasant 72 year old male with a history of multiple spinal surgeries and extensive lumbar spinal fusion who presents with bilateral leg pain and right leg weakness.  He states that the leg pain started after his most recent thoracic decompression from T9-T12.  He has a history of right foot drop that started in 2020.  He tries to stay as active as possible but ambulates within antalgic gait.  He states that the pain in his legs and is worse with standing and walking and worse on the right side.  Sitting down does alleviate his pain.  Currently takes gabapentin 300 mg nightly.  He states that taking a daytime dose made him too sedated.  He has also tried anti-inflammatories including ibuprofen, BCs, naproxen.  In regards to his past surgical history, he had an instrumented fusion at L1-L2 in the 1990s followed by cervical fusion followed then by an L2-L4 fusion with removal of L1 hardware on 04/05/2019 followed by an instrumented fusion from L2-S1 on 05/14/2020 and then most recently a thoracic decompression at T9-T12 on 08/06/2020.  He has been evaluated by neurosurgery and surgery was advised against as this would be a very large spinal fusion.  He is here to consider other options for pain management.  Meds  Current Outpatient Medications:    Aspirin-Caffeine (BC FAST PAIN RELIEF) 845-65 MG PACK, Take 1 packet by mouth daily as needed (pain.)., Disp: , Rfl:    lisinopril-hydrochlorothiazide (ZESTORETIC)  20-12.5 MG tablet, Take 1 tablet by mouth daily., Disp: , Rfl:    lovastatin (MEVACOR) 20 MG tablet, Take 20 mg by mouth every evening. , Disp: , Rfl:    omeprazole (PRILOSEC) 40 MG capsule, Take 40 mg by mouth daily as needed (heartburn). , Disp: , Rfl:    triamcinolone cream (KENALOG) 0.1 %, Apply 1 application topically daily as needed (rash). , Disp: , Rfl: 99   gabapentin (NEURONTIN) 300 MG capsule, Take 1-2 capsules (300-600 mg total) by mouth every evening., Disp: 60 capsule, Rfl: 1  Imaging Review   MR THORACIC SPINE WO CONTRAST  Narrative CLINICAL DATA:  Mid to low back pain with bilateral leg and foot pain since prior back surgery. Left foot drop.  EXAM: MRI THORACIC AND LUMBAR SPINE WITHOUT CONTRAST  TECHNIQUE: Multiplanar and multiecho pulse sequences of the thoracic and lumbar spine were obtained without intravenous contrast.  COMPARISON:  Scoliosis series 10/03/2022. MRI thoracic spine 07/17/2020. MRI lumbar spine 07/01/2020.  FINDINGS: MRI THORACIC SPINE FINDINGS  Alignment:  Stable and near anatomic.  Vertebrae: Postsurgical changes in the lower cervical spine. No evidence of acute thoracic fracture or discitis. There are progressive chronic endplate degenerative changes in the lower thoracic spine status post interval posterior decompression from T9-10 through T11-12. No endplate destruction or discal hyperintensity to suggest discitis.  Cord: No acute cord abnormality identified. Possible chronic myelomalacia in the lower thoracic cord.  Paraspinal and other soft tissues: No acute paraspinal findings.  Disc levels:  Mild disc bulging and scattered small disc protrusions in the upper to midthoracic spine without cord deformity or significant foraminal narrowing.  T7-8: Moderate-sized left paracentral disc protrusion with mild mass effect on the cord is unchanged.  T8-9: Chronic spondylosis with posterior osteophytes covering diffusely bulging disc  material. The CSF surrounding the cord is effaced with mild cord flattening and possible mild cord hyperintensity. Chronic foraminal narrowing bilaterally.  T9-10: Chronic spondylosis with posterior osteophytes covering diffusely bulging disc material. Interval posterior decompression with improved patency of the spinal canal which remains moderately narrowed transversely. The CSF surrounding the cord is effaced with mild cord flattening and possible mild cord hyperintensity. Chronic foraminal narrowing bilaterally.  T10-11: Chronic spondylosis with posterior osteophytes covering diffusely bulging disc material. Interval posterior decompression with improved patency of the spinal canal which remains moderately narrowed transversely. Effacement of the CSF surrounding the cord without cord deformity. Chronic foraminal narrowing bilaterally.  T11-12: Interval posterior decompression with improved patency of the spinal canal. No cord deformity. Bilateral facet hypertrophy with moderate to severe foraminal narrowing bilaterally.  MRI LUMBAR SPINE FINDINGS  Segmentation:  There are 5 lumbar type vertebral bodies.  Alignment: Straightening without significant angulation or listhesis.  Vertebrae: Status post lumbar fusion from L1 through S1. Remote anterior interbody fusion from L1 through L4 appears solid. More recent extension of the fusion across the L4-5 and L5-S1 disc spaces. No evidence of acute fracture or hardware displacement.  Conus medullaris and cauda equina: Conus extends to the L1-2 level. Conus and cauda equina appear normal.  Paraspinal and other soft tissues: Unremarkable.  Disc levels:  T12-L1: Progressive loss of disc height with disc bulging and a broad-based disc protrusion in the right subarticular zone. Resulting mild mass effect on the distal thoracic cord with asymmetric mild narrowing of the  right lateral recess and right foramen.  The spinal canal and  neural foramina remain patent at the remotely fused L1-2, L2-3 and L3-4 levels.  L4-5: The spinal canal and neural foramina are widely patent post laminectomy and interbody fusion. No significant residual fluid collection within the laminectomy bed.  L5-S1: Probable residual posterior osteophytes asymmetric to the right. The spinal canal and lateral recesses appear decompressed. Suspected residual right-greater-than-left foraminal narrowing, partly obscured by hardware artifact.  IMPRESSION: 1. Compared with previous MRI of the thoracic spine from 07/01/2020, there has been interval posterior decompression from T9-10 through T11-12 with improved patency of the spinal canal. There is residual effacement of the CSF surrounding the cord at the operative levels with possible mild cord hyperintensity which could reflect edema or myelomalacia. 2. Stable left paracentral disc protrusion at T7-8 with mild mass effect on the cord. 3. 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 4. No acute findings identified in the lumbar spine. 5. Previous multilevel lumbar fusion with more recent extension across the L4-5 and L5-S1 disc spaces. There is good patency of the spinal canal throughout the lumbar spine with suspected residual foraminal narrowing bilaterally at L5-S1. No significant residual paraspinal fluid collections are seen.   Electronically Signed By: Carey Bullocks M.D. On: 10/04/2022 16:18   Narrative CLINICAL DATA:  Intraoperative localization for laminectomy  EXAM: THORACIC SPINE 2 VIEWS  COMPARISON:  07/01/2020  FINDINGS: Surgical retractors and instruments are noted posterior to the T9-T10 level. Numbering nomenclature is similar to that utilized on prior plain film evaluation.  Multilevel lumbar fusion is seen from L1-S1.  IMPRESSION: Intraoperative localization at T9-10.   Electronically Signed By: Alcide Clever M.D. On: 08/06/2020 19:44    MR LUMBAR SPINE WO CONTRAST  Narrative CLINICAL DATA:  Mid to low back pain with bilateral leg and foot pain since prior back surgery. Left foot drop.  EXAM: MRI THORACIC AND LUMBAR SPINE WITHOUT CONTRAST  TECHNIQUE: Multiplanar and multiecho pulse sequences of the thoracic and lumbar spine were obtained without intravenous contrast.  COMPARISON:  Scoliosis series 10/03/2022. MRI thoracic spine 07/17/2020. MRI lumbar spine 07/01/2020.  FINDINGS: MRI THORACIC SPINE FINDINGS  Alignment:  Stable and near anatomic.  Vertebrae: Postsurgical changes in the lower cervical spine. No evidence of acute thoracic fracture or discitis. There are progressive chronic endplate degenerative changes in the lower thoracic spine status post interval posterior decompression from T9-10 through T11-12. No endplate destruction or discal hyperintensity to suggest discitis.  Cord: No acute cord abnormality identified. Possible chronic myelomalacia in the lower thoracic cord.  Paraspinal and other soft tissues: No acute paraspinal findings.  Disc levels:  Mild disc bulging and scattered small disc protrusions in the upper to midthoracic spine without cord deformity or significant foraminal narrowing.  T7-8: Moderate-sized left paracentral disc protrusion with mild mass effect on the cord is unchanged.  T8-9: Chronic spondylosis with posterior osteophytes covering diffusely bulging disc material. The CSF surrounding the cord is effaced with mild cord flattening and possible mild cord hyperintensity. Chronic foraminal narrowing bilaterally.  T9-10: Chronic spondylosis with posterior osteophytes covering diffusely bulging disc material. Interval posterior decompression with improved patency of the spinal canal which  remains moderately narrowed transversely. The CSF surrounding the cord is effaced with mild cord flattening and possible mild cord hyperintensity. Chronic foraminal narrowing bilaterally.  T10-11:  Chronic spondylosis with posterior osteophytes covering diffusely bulging disc material. Interval posterior decompression with improved patency of the spinal canal which remains moderately narrowed transversely. Effacement of the CSF surrounding the cord without cord deformity. Chronic foraminal narrowing bilaterally.  T11-12: Interval posterior decompression with improved patency of the spinal canal. No cord deformity. Bilateral facet hypertrophy with moderate to severe foraminal narrowing bilaterally.  MRI LUMBAR SPINE FINDINGS  Segmentation:  There are 5 lumbar type vertebral bodies.  Alignment: Straightening without significant angulation or listhesis.  Vertebrae: Status post lumbar fusion from L1 through S1. Remote anterior interbody fusion from L1 through L4 appears solid. More recent extension of the fusion across the L4-5 and L5-S1 disc spaces. No evidence of acute fracture or hardware displacement.  Conus medullaris and cauda equina: Conus extends to the L1-2 level. Conus and cauda equina appear normal.  Paraspinal and other soft tissues: Unremarkable.  Disc levels:  T12-L1: Progressive loss of disc height with disc bulging and a broad-based disc protrusion in the right subarticular zone. Resulting mild mass effect on the distal thoracic cord with asymmetric mild narrowing of the right lateral recess and right foramen.  The spinal canal and neural foramina remain patent at the remotely fused L1-2, L2-3 and L3-4 levels.  L4-5: The spinal canal and neural foramina are widely patent post laminectomy and interbody fusion. No significant residual fluid collection within the laminectomy bed.  L5-S1: Probable residual posterior osteophytes asymmetric to the right. The  spinal canal and lateral recesses appear decompressed. Suspected residual right-greater-than-left foraminal narrowing, partly obscured by hardware artifact.  IMPRESSION: 1. Compared with previous MRI of the thoracic spine from 07/01/2020, there has been interval posterior decompression from T9-10 through T11-12 with improved patency of the spinal canal. There is residual effacement of the CSF surrounding the cord at the operative levels with possible mild cord hyperintensity which could reflect edema or myelomalacia. 2. Stable left paracentral disc protrusion at T7-8 with mild mass effect on the cord. 3. 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 4. No acute findings identified in the lumbar spine. 5. Previous multilevel lumbar fusion with more recent extension across the L4-5 and L5-S1 disc spaces. There is good patency of the spinal canal throughout the lumbar spine with suspected residual foraminal narrowing bilaterally at L5-S1. No significant residual paraspinal fluid collections are seen.   Electronically Signed By: Carey Bullocks M.D. On: 10/04/2022 16:18  MR Lumbar Spine W Wo Contrast  Narrative CLINICAL DATA:  Low back pain. Previous surgery. Left hip and leg pain. Weakness and numbness. Symptoms worsened over the last month.  Creatinine was obtained on site at [facility] at [address]  Results: Creatinine 1.1 mg/dL.  EXAM: MRI LUMBAR SPINE WITHOUT AND WITH CONTRAST  TECHNIQUE: Multiplanar and multiecho pulse sequences of the lumbar spine were obtained without and with intravenous contrast.  CONTRAST:  15mL MULTIHANCE GADOBENATE DIMEGLUMINE 529 MG/ML IV SOLN  COMPARISON:  Radiography 02/14/2019.  MRI 09/13/2014.  FINDINGS: Segmentation: 5 lumbar  type vertebral bodies as numbered previously.  Alignment:  4 mm degenerative anterolisthesis L4-5.  Vertebrae: Distant solid fusion at the L1-2 level. Discogenic endplate changes from L2-3 through L5-S1 without pronounced edema. Mild edema at the L4-5 level.  Conus medullaris and cauda equina: Conus extends to the L1-2 level. Conus and cauda equina appear normal.  Paraspinal and other soft tissues: Negative  Disc levels:  T11-12: Shallow disc protrusion narrows the ventral subarachnoid space but does not grossly compress the cord. Mild facet hypertrophy. This level was not studied in the axial plane.  T12-L1: Mild bulging of the disc.  No compressive stenosis.  L1-2: Distant PLIF. Solid union. Wide patency of the canal and foramina.  L2-3: Severe multifactorial spinal stenosis. Endplate osteophytes. Broad-based disc herniation. Facet and ligamentous hypertrophy. Prominent dorsal epidural fat. Neural compression could occur on either or both sides at this level.  L3-4: Severe multifactorial spinal stenosis. Endplate osteophytes and broad-based disc herniation. Facet and ligamentous hypertrophy. Prominent dorsal epidural fat. Bilateral foraminal stenosis. Neural compression could occur on either or both sides at this level.  L4-5: Facet arthropathy with 4 mm of anterolisthesis. Endplate osteophytes and broad-based disc herniation. Stenosis of both lateral recesses and neural foramina, left more than right. Neural compression could occur on either side at this level, more likely the left.  L5-S1: Endplate osteophytes and broad-based disc herniation. Facet degeneration and hypertrophy. Stenosis of both neural foramina that could compress either or both L5 nerves.  Compared to the study of 2015, findings are considerably worsened.  IMPRESSION: T11-12: Shallow disc protrusion. Facet and ligamentous hypertrophy. Canal stenosis without gross cord compression. This level was  not studied in the axial plane.  L1-2: Previous PLIF has a good appearance.  L2-3: Severe multifactorial spinal stenosis that could cause neural compression on either or both sides.  L3-4: Severe multifactorial spinal stenosis that could cause neural compression on either or both sides.  L4-5: Bilateral foraminal stenosis that could compress either exiting L5 nerve, more likely the left. Facet arthropathy with 4 mm of degenerative anterolisthesis.  L5-S1: Broad-based disc herniation. Severe bilateral foraminal stenosis could compress either L5 nerve.   Electronically Signed By: Paulina Fusi M.D.   DG Lumbar Spine 2-3 Views  Narrative CLINICAL DATA:  Elective surgery. L4-5-S1 posterolateral interbody fusion.  EXAM: LUMBAR SPINE - 2-3 VIEW; DG C-ARM 1-60 MIN  COMPARISON:  04/05/2019  FINDINGS: Remote posterior fusion of L2-L4. Interval placement of transpedicular screws at L5 and S1 with interbody fusion devices identified at L4-5 and L5-S1.  IMPRESSION: Intraoperative images during posterior fusion at L4-S1.   Electronically Signed By: Norva Pavlov M.D. On: 05/14/2020 15:30   Complexity Note: Imaging results reviewed.                         ROS  Cardiovascular: High blood pressure Pulmonary or Respiratory: Smoking Neurological: No reported neurological signs or symptoms such as seizures, abnormal skin sensations, urinary and/or fecal incontinence, being born with an abnormal open spine and/or a tethered spinal cord Psychological-Psychiatric: Anxiousness and Depressed Gastrointestinal: Reflux or heatburn Genitourinary: No reported renal or genitourinary signs or symptoms such as difficulty voiding or producing urine, peeing blood, non-functioning kidney, kidney stones, difficulty emptying the bladder, difficulty controlling the flow of urine, or chronic kidney disease Hematological: Abnormal red blood cell shape problems (Sickle Cell disease or trait) and  No reported hematological signs or symptoms such as prolonged bleeding, low or poor functioning platelets, bruising or bleeding easily, hereditary bleeding problems, low energy levels due to low hemoglobin or being anemic Endocrine: No reported endocrine signs or symptoms such as high or low blood sugar, rapid heart rate due to high thyroid levels, obesity or weight gain due to slow thyroid or thyroid disease Rheumatologic: No reported rheumatological signs and symptoms such as fatigue,  joint pain, tenderness, swelling, redness, heat, stiffness, decreased range of motion, with or without associated rash Musculoskeletal: Negative for myasthenia gravis, muscular dystrophy, multiple sclerosis or malignant hyperthermia Work History: Retired  Allergies  Jose Kline is allergic to soap.  Laboratory Chemistry Profile   Renal Lab Results  Component Value Date   BUN 19 03/23/2022   CREATININE 1.18 03/23/2022   GFRAA >60 05/14/2020   GFRNONAA >60 03/23/2022   PROTEINUR NEGATIVE 03/23/2022     Electrolytes Lab Results  Component Value Date   NA 139 03/23/2022   K 3.6 03/23/2022   CL 104 03/23/2022   CALCIUM 9.2 03/23/2022     Hepatic Lab Results  Component Value Date   AST 21 03/23/2022   ALT 11 03/23/2022   ALBUMIN 3.7 03/23/2022   ALKPHOS 78 03/23/2022   LIPASE 37 03/23/2022     ID Lab Results  Component Value Date   SARSCOV2NAA NEGATIVE 08/04/2020   STAPHAUREUS NEGATIVE 08/04/2020   MRSAPCR NEGATIVE 08/04/2020     Bone No results found for: "VD25OH", "VD125OH2TOT", "LK4401UU7", "OZ3664QI3", "25OHVITD1", "25OHVITD2", "25OHVITD3", "TESTOFREE", "TESTOSTERONE"   Endocrine Lab Results  Component Value Date   GLUCOSE 119 (H) 03/23/2022   GLUCOSEU NEGATIVE 03/23/2022     Neuropathy No results found for: "VITAMINB12", "FOLATE", "HGBA1C", "HIV"   CNS No results found for: "COLORCSF", "APPEARCSF", "RBCCOUNTCSF", "WBCCSF", "POLYSCSF", "LYMPHSCSF", "EOSCSF", "PROTEINCSF",  "GLUCCSF", "JCVIRUS", "CSFOLI", "IGGCSF", "LABACHR", "ACETBL"   Inflammation (CRP: Acute  ESR: Chronic) No results found for: "CRP", "ESRSEDRATE", "LATICACIDVEN"   Rheumatology No results found for: "RF", "ANA", "LABURIC", "URICUR", "LYMEIGGIGMAB", "LYMEABIGMQN", "HLAB27"   Coagulation Lab Results  Component Value Date   PLT 260 03/23/2022     Cardiovascular Lab Results  Component Value Date   HGB 12.3 (L) 03/23/2022   HCT 37.6 (L) 03/23/2022     Screening Lab Results  Component Value Date   SARSCOV2NAA NEGATIVE 08/04/2020   COVIDSOURCE NASOPHARYNGEAL 04/03/2019   STAPHAUREUS NEGATIVE 08/04/2020   MRSAPCR NEGATIVE 08/04/2020     Cancer No results found for: "CEA", "CA125", "LABCA2"   Allergens No results found for: "ALMOND", "APPLE", "ASPARAGUS", "AVOCADO", "BANANA", "BARLEY", "BASIL", "BAYLEAF", "GREENBEAN", "LIMABEAN", "WHITEBEAN", "BEEFIGE", "REDBEET", "BLUEBERRY", "BROCCOLI", "CABBAGE", "MELON", "CARROT", "CASEIN", "CASHEWNUT", "CAULIFLOWER", "CELERY"     Note: Lab results reviewed.  PFSH  Drug: Jose Kline  reports no history of drug use. Alcohol:  reports current alcohol use. Tobacco:  reports that he has been smoking cigarettes. He started smoking about 56 years ago. He has a 75.00 pack-year smoking history. His smokeless tobacco use includes chew. Medical:  has a past medical history of Anxiety, Complication of anesthesia, DDD (degenerative disc disease), lumbosacral, GERD (gastroesophageal reflux disease), Headache, Hyperlipidemia, and Hypertension. Family: family history includes Cancer in his mother; Kidney disease in his mother.  Past Surgical History:  Procedure Laterality Date   DECOMPRESSIVE LUMBAR LAMINECTOMY LEVEL 3  08/06/2020   Procedure: THORACIC DECOMPRESSION LEVELS T9-T12;  Surgeon: Donalee Citrin, MD;  Location: [facility] OR;  Service: Neurosurgery;;   EYE SURGERY Bilateral    cataract extraction with implants   HERNIA REPAIR     umbilical   INGUINAL  HERNIA REPAIR Right 12/29/2017   Procedure: HERNIA REPAIR INGUINAL ADULT;  Surgeon: Carolan Shiver, MD;  Location: [facility] ORS;  Service: General;  Laterality: Right;   INSERTION OF MESH Right 12/29/2017   Procedure: INSERTION OF MESH;  Surgeon: Carolan Shiver, MD;  Location: [facility] ORS;  Service: General;  Laterality: Right;   IR GENERIC HISTORICAL  12/25/2014  IR RADIOLOGIST EVAL & MGMT 12/25/2014 Berdine Dance, MD [facility] INTERV RAD   LUMBAR LAMINECTOMY/DECOMPRESSION MICRODISCECTOMY  08/06/2020   Procedure: THORACIC LAMINECTOMY/DECOMPRESSION MICRODISCECTOMY LEVEL T11-T12;  Surgeon: Donalee Citrin, MD;  Location: [facility] OR;  Service: Neurosurgery;;   [facility] SURGERY  04/2011   1990's was first back surgery.  2 rods in middle of back. Disc degenerative disease   THORACIC DISCECTOMY N/A 08/06/2020   Procedure: Laminectomy and Foraminotomy - Thoracic nine-ten, Thoracic ten-eleven, Thoracic eleven-twelve;  Surgeon: Donalee Citrin, MD;  Location: [facility] OR;  Service: Neurosurgery;  Laterality: N/A;   TONSILLECTOMY AND ADENOIDECTOMY     as a child   Active Ambulatory Problems    Diagnosis Date Noted   Spinal stenosis, lumbar region, with neurogenic claudication 04/05/2019   Spondylolisthesis at L4-L5 level 05/14/2020   HNP (herniated nucleus pulposus with myelopathy), thoracic 08/06/2020   History of lumbar spinal fusion 03/08/2023   Lumbar radiculopathy 03/08/2023   Lumbar degenerative disc disease 03/08/2023   Chronic pain syndrome 03/08/2023   Resolved Ambulatory Problems    Diagnosis Date Noted   No Resolved Ambulatory Problems   Past Medical History:  Diagnosis Date   Anxiety    Complication of anesthesia    DDD (degenerative disc disease), lumbosacral    GERD (gastroesophageal reflux disease)    Headache    Hyperlipidemia    Hypertension    Constitutional Exam  General appearance: Well nourished, well developed, and well hydrated. In no apparent acute distress Vitals:   03/08/23 0833  BP:  129/80  Pulse: 68  Resp: 16  Temp: 98.4 F (36.9 C)  SpO2: 100%  Weight: 156 lb (70.8 kg)  Height: 5\' 6"  (1.676 m)   BMI Assessment: Estimated body mass index is 25.18 kg/m as calculated from the following:   Height as of this encounter: 5\' 6"  (1.676 m).   Weight as of this encounter: 156 lb (70.8 kg).  BMI interpretation table: BMI level Category Range association with higher incidence of chronic pain  <18 kg/m2 Underweight   18.5-24.9 kg/m2 Ideal body weight   25-29.9 kg/m2 Overweight Increased incidence by 20%  30-34.9 kg/m2 Obese (Class I) Increased incidence by 68%  35-39.9 kg/m2 Severe obesity (Class II) Increased incidence by 136%  >40 kg/m2 Extreme obesity (Class III) Increased incidence by 254%   Patient's current BMI Ideal Body weight  Body mass index is 25.18 kg/m. Ideal body weight: 63.8 kg (140 lb 10.5 oz) Adjusted ideal body weight: 66.6 kg (146 lb 12.7 oz)   BMI Readings from Last 4 Encounters:  03/08/23 25.18 kg/m  10/07/22 24.53 kg/m  09/10/22 24.66 kg/m  08/07/20 21.76 kg/m   Wt Readings from Last 4 Encounters:  03/08/23 156 lb (70.8 kg)  10/07/22 152 lb (68.9 kg)  09/10/22 152 lb 12.8 oz (69.3 kg)  08/07/20 134 lb 12.8 oz (61.1 kg)    Psych/Mental status: Alert, oriented x 3 (person, place, & time)       Eyes: PERLA Respiratory: No evidence of acute respiratory distress  Cervical Spine Area Exam  Skin & Axial Inspection: Well healed scar from previous spine surgery detected Alignment: Symmetrical Functional ROM: Fused      Stability: No instability detected Muscle Tone/Strength: Functionally intact. No obvious neuro-muscular anomalies detected. Sensory (Neurological): Neurogenic pain pattern Palpation: No palpable anomalies             Upper Extremity (UE) Exam    Side: Right upper extremity  Side: Left upper extremity  Skin & Extremity Inspection: Skin  color, temperature, and hair growth are WNL. No peripheral edema or cyanosis. No  masses, redness, swelling, asymmetry, or associated skin lesions. No contractures.  Skin & Extremity Inspection: Skin color, temperature, and hair growth are WNL. No peripheral edema or cyanosis. No masses, redness, swelling, asymmetry, or associated skin lesions. No contractures.  Functional ROM: Unrestricted ROM          Functional ROM: Unrestricted ROM          Muscle Tone/Strength: Functionally intact. No obvious neuro-muscular anomalies detected.  Muscle Tone/Strength: Functionally intact. No obvious neuro-muscular anomalies detected.  Sensory (Neurological): Unimpaired          Sensory (Neurological): Unimpaired          Palpation: No palpable anomalies              Palpation: No palpable anomalies              Provocative Test(s):  Phalen's test: deferred Tinel's test: deferred Apley's scratch test (touch opposite shoulder):  Action 1 (Across chest): Decreased ROM Action 2 (Overhead): Decreased ROM Action 3 (LB reach): Decreased ROM   Provocative Test(s):  Phalen's test: deferred Tinel's test: deferred Apley's scratch test (touch opposite shoulder):  Action 1 (Across chest): Decreased ROM Action 2 (Overhead): Decreased ROM Action 3 (LB reach): Decreased ROM    Thoracic Spine Area Exam  Skin & Axial Inspection: Well healed scar from previous spine surgery detected Alignment: Symmetrical Functional ROM: Pain restricted ROM Stability: No instability detected Muscle Tone/Strength: Functionally intact. No obvious neuro-muscular anomalies detected. Sensory (Neurological): Musculoskeletal pain pattern Muscle strength & Tone: No palpable anomalies Lumbar Spine Area Exam  Skin & Axial Inspection: Well healed scar from previous spine surgery detected Alignment: Symmetrical Functional ROM: Fused       Stability: No instability detected Muscle Tone/Strength: Functionally intact. No obvious neuro-muscular anomalies detected. Sensory (Neurological): Neurogenic pain pattern Palpation:  Complains of area being tender to palpation       Provocative Tests: Hyperextension/rotation test: Unable to perform due to fusion restriction. Lumbar quadrant test (Kemp's test): (+) bilateral for foraminal stenosis Lateral bending test: (+) ipsilateral radicular pain, bilaterally. Positive for bilateral foraminal stenosis.  Gait & Posture Assessment  Ambulation: Limited Gait: Antalgic gait (limping) Posture: Difficulty standing up straight, due to pain  Lower Extremity Exam    Side: Right lower extremity  Side: Left lower extremity  Stability: No instability observed          Stability: No instability observed          Skin & Extremity Inspection: Skin color, temperature, and hair growth are WNL. No peripheral edema or cyanosis. No masses, redness, swelling, asymmetry, or associated skin lesions. No contractures.  Skin & Extremity Inspection: Skin color, temperature, and hair growth are WNL. No peripheral edema or cyanosis. No masses, redness, swelling, asymmetry, or associated skin lesions. No contractures.  Functional ROM: Pain restricted ROM for all joints of the lower extremity          Functional ROM: Pain restricted ROM for all joints of the lower extremity          Muscle Tone/Strength: S1 myotomal weakness (Foot Plantar Flexion)  Muscle Tone/Strength: Movement possible against some resistance (4/5)  Sensory (Neurological): Neurogenic pain pattern        Sensory (Neurological): Neurogenic pain pattern        DTR: Patellar: deferred today Achilles: deferred today Plantar: deferred today  DTR: Patellar: deferred today Achilles: deferred today Plantar: deferred today  Palpation: No palpable anomalies  Palpation: No palpable anomalies    Assessment  Primary Diagnosis & Pertinent Problem List: The primary encounter diagnosis was History of lumbar spinal fusion. Diagnoses of Chronic radicular lumbar pain, Spinal stenosis, lumbar region, with neurogenic claudication, Lumbar  radiculopathy, Lumbar degenerative disc disease, and Chronic pain syndrome were also pertinent to this visit.  Visit Diagnosis (New problems to examiner): 1. History of lumbar spinal fusion   2. Chronic radicular lumbar pain   3. Spinal stenosis, lumbar region, with neurogenic claudication   4. Lumbar radiculopathy   5. Lumbar degenerative disc disease   6. Chronic pain syndrome    Plan of Care (Initial workup plan)  I reviewed imaging studies with Jose Kline.  He has had an extensive lumbar spinal fusion.  I am impressed that he is still able to ambulate and be as active as he is given his degree of fusion, associated spondylosis and foraminal stenosis.  We had an extensive discussion regarding peripheral nerve stimulation and unfortunately given the presence of extensive lumbar hardware, I do not recommend a peripheral nerve stimulator.  For his chronic lumbar radicular pain, we discussed a caudal epidural steroid injection.  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  We discussed a diagnostic SI joint injection and possible piriformis trigger point injection in the future.  We will start with a caudal epidural steroid injection.  Risk and benefits of this were discussed and patient would like to proceed.  In regards to medication management, we discussed increasing his gabapentin to 600 mg nightly to see if that provides analgesic benefit.  Future considerations could include pregabalin.  We also discussed smoking cessation and how smoking can increase chronic pain.  Patient did physical therapy approximately 1 year ago and continues to try and do stretching exercises at home that he has learned..  I will see him back in a couple of weeks for his caudal ESI.  Procedure Orders         Caudal Epidural Injection     Pharmacotherapy (current): Medications ordered:  Meds ordered this encounter   Medications   gabapentin (NEURONTIN) 300 MG capsule    Sig: Take 1-2 capsules (300-600 mg total) by mouth every evening.    Dispense:  60 capsule    Refill:  1   Medications administered during this visit: Tunney Lamprecht. Wallen had no medications administered during this visit.   Provider-requested follow-up: Return in about 20 days (around 03/28/2023) for caudal ESI, in clinic [facility].  Future Appointments  Date Time Provider Department Center  03/28/2023  8:20 AM Edward Jolly, MD [facility] None    Duration of encounter: .  Total time on encounter, as per AMA guidelines included both the face-to-face and non-face-to-face time personally spent by the physician and/or other qualified health care professional(s) on the day of the encounter (includes time in activities that require the physician or other qualified health care professional and does not include time in activities normally performed by clinical staff). Physician's time may include the following activities when performed: Preparing to see the patient (e.g., pre-charting review of records, searching for previously ordered imaging, lab work, and nerve conduction tests) Review of prior analgesic pharmacotherapies. Reviewing PMP Interpreting ordered tests (e.g., lab work, imaging, nerve conduction tests) Performing post-procedure evaluations, including interpretation of diagnostic procedures Obtaining and/or reviewing separately obtained history Performing a medically appropriate examination and/or evaluation Counseling and educating the  patient/family/caregiver Ordering medications, tests, or procedures Referring and communicating with other health care professionals (when not separately reported) Documenting clinical information in the electronic or other health record Independently interpreting results (not separately reported) and communicating results to the patient/ family/caregiver Care coordination (not separately  reported)  Note by: Edward Jolly, MD (TTS technology used. I apologize for any typographical errors that were not detected and corrected.) Date: 03/08/2023; Time: 10:03 AM

## 2023-03-08 NOTE — Progress Notes (Signed)
Safety precautions to be maintained throughout the outpatient stay will include: orient to surroundings, keep bed in low position, maintain call bell within reach at all times, provide assistance with transfer out of bed and ambulation.  

## 2023-03-28 ENCOUNTER — Encounter: Payer: Self-pay | Admitting: Student in an Organized Health Care Education/Training Program

## 2023-03-28 ENCOUNTER — Ambulatory Visit
Payer: Medicare Other | Attending: Student in an Organized Health Care Education/Training Program | Admitting: Student in an Organized Health Care Education/Training Program

## 2023-03-28 ENCOUNTER — Ambulatory Visit
Admission: RE | Admit: 2023-03-28 | Discharge: 2023-03-28 | Disposition: A | Discharge status: Home / Self Care | Payer: Medicare Other | Source: Ambulatory Visit | Attending: Student in an Organized Health Care Education/Training Program | Admitting: Student in an Organized Health Care Education/Training Program

## 2023-03-28 VITALS — BP 114/80 | HR 78 | Temp 98.3°F | Resp 16 | Ht 66.0 in | Wt 152.0 lb

## 2023-03-28 DIAGNOSIS — Z981 Arthrodesis status: Secondary | ICD-10-CM

## 2023-03-28 DIAGNOSIS — G8929 Other chronic pain: Secondary | ICD-10-CM | POA: Diagnosis present

## 2023-03-28 DIAGNOSIS — M48062 Spinal stenosis, lumbar region with neurogenic claudication: Secondary | ICD-10-CM | POA: Insufficient documentation

## 2023-03-28 DIAGNOSIS — M5416 Radiculopathy, lumbar region: Secondary | ICD-10-CM | POA: Insufficient documentation

## 2023-03-28 MED ORDER — LIDOCAINE HCL 2 % IJ SOLN
INTRAMUSCULAR | Status: AC
  Filled 2023-03-28: qty 20

## 2023-03-28 MED ORDER — ROPIVACAINE HCL 2 MG/ML IJ SOLN
INTRAMUSCULAR | Status: AC
  Filled 2023-03-28: qty 20

## 2023-03-28 MED ORDER — IOHEXOL 180 MG/ML  SOLN
INTRAMUSCULAR | Status: AC
  Filled 2023-03-28: qty 20

## 2023-03-28 MED ORDER — DIAZEPAM 5 MG PO TABS
ORAL_TABLET | ORAL | Status: AC
  Filled 2023-03-28: qty 1

## 2023-03-28 MED ORDER — SODIUM CHLORIDE (PF) 0.9 % IJ SOLN
INTRAMUSCULAR | Status: AC
  Filled 2023-03-28: qty 10

## 2023-03-28 MED ORDER — DIAZEPAM 5 MG PO TABS
5.0000 mg | ORAL_TABLET | ORAL | Status: AC
  Administered 2023-03-28: 5 mg via ORAL

## 2023-03-28 MED ORDER — IOHEXOL 180 MG/ML  SOLN
10.0000 mL | Freq: Once | INTRAMUSCULAR | Status: AC
  Administered 2023-03-28: 10 mL via EPIDURAL

## 2023-03-28 MED ORDER — DEXAMETHASONE SODIUM PHOSPHATE 10 MG/ML IJ SOLN
INTRAMUSCULAR | Status: AC
  Filled 2023-03-28: qty 1

## 2023-03-28 MED ORDER — ROPIVACAINE HCL 2 MG/ML IJ SOLN
4.0000 mL | Freq: Once | INTRAMUSCULAR | Status: AC
  Administered 2023-03-28: 4 mL via INTRA_ARTICULAR

## 2023-03-28 MED ORDER — DEXAMETHASONE SODIUM PHOSPHATE 10 MG/ML IJ SOLN
10.0000 mg | Freq: Once | INTRAMUSCULAR | Status: AC
  Administered 2023-03-28: 10 mg

## 2023-03-28 MED ORDER — LIDOCAINE HCL 2 % IJ SOLN
20.0000 mL | Freq: Once | INTRAMUSCULAR | Status: AC
  Administered 2023-03-28: 100 mg

## 2023-03-28 NOTE — Patient Instructions (Signed)

## 2023-03-28 NOTE — Progress Notes (Signed)
PROVIDER NOTE: Interpretation of information contained herein should be left to medically-trained personnel. Specific patient instructions are provided elsewhere under "Patient Instructions" section of medical record. This document was created in part using STT-dictation technology, any transcriptional errors that may result from this process are unintentional.  Patient: Jose Kline Type: Established DOB: Jan 21, 1951 MRN: [id number] PCP: Burton Apley, MD  Service: Procedure DOS: 03/28/2023 Setting: Ambulatory Location: Ambulatory outpatient facility Delivery: Face-to-face Provider: Edward Jolly, MD Specialty: Interventional Pain Management Specialty designation: 09 Location: Outpatient facility Ref. Prov.: Burton Apley, MD       Interventional Therapy   Procedure: Caudal Epidural Steroid Injection  #1  Laterality: Midline         Level: Sacrococcygeal ligament  Imaging: Fluoroscopy-guided         Anesthesia: Local anesthesia (1-2% Lidocaine) DOS: 03/28/2023  Performed by: Edward Jolly, MD  Purpose: Diagnostic/Therapeutic Indications: Low back and lower extremity pain severe enough to impact quality of life or function. Rationale (medical necessity): procedure needed and proper for the diagnosis and/or treatment of Jose Kline's medical symptoms and needs. 1. Chronic radicular lumbar pain   2. History of lumbar spinal fusion   3. Spinal stenosis, lumbar region, with neurogenic claudication   4. Lumbar radiculopathy    NAS-11 Pain score:   Pre-procedure: 4 /10   Post-procedure: 0-No pain/10     Target: Lumbosacral epidural canal  Location: Epidural space  Region: Caudal canal Approach: Percutaneous  Type of procedure: Epidural block  Position  Prep  Materials:  Position: Prone  Prep solution: DuraPrep (Iodine Povacrylex [0.7% available iodine] and Isopropyl Alcohol, 74% w/w) Prep Area: Entire posterior lumbosacral area  Materials:  Tray: Epidural Needle(s):   Type: Epidural  Gauge (G): 22  Length: 3.5-in  Qty: 1   Pre-op H&P Assessment:  Jose Kline is a 72 y.o. (year old), male patient, seen today for interventional treatment. He  has a past surgical history that includes Spine surgery (04/2011); ir generic historical (12/25/2014); Tonsillectomy and adenoidectomy; Inguinal hernia repair (Right, 12/29/2017); Insertion of mesh (Right, 12/29/2017); Hernia repair; Eye surgery (Bilateral); Thoracic discectomy (N/A, 08/06/2020); Decompressive lumbar laminectomy level 3 (08/06/2020); and Lumbar laminectomy/decompression microdiscectomy (08/06/2020). Jose Kline has a current medication list which includes the following prescription(s): bc fast pain relief, gabapentin, lisinopril-hydrochlorothiazide, lovastatin, omeprazole, and triamcinolone cream. His primarily concern today is the Leg Pain (bilat)  Initial Vital Signs:  Pulse/HCG Rate: 78ECG Heart Rate: (!) 55 Temp: 98.3 F (36.8 C) Resp: 18 BP: 110/83 SpO2: 100 %  BMI: Estimated body mass index is 24.53 kg/m as calculated from the following:   Height as of this encounter: 5\' 6"  (1.676 m).   Weight as of this encounter: 152 lb (68.9 kg).  Risk Assessment: Allergies: Reviewed. He is allergic to soap.  Allergy Precautions: None required Coagulopathies: Reviewed. None identified.  Blood-thinner therapy: None at this time Active Infection(s): Reviewed. None identified. Jose Kline is afebrile  Site Confirmation: Jose Kline was asked to confirm the procedure and laterality before marking the site Procedure checklist: Completed Consent: Before the procedure and under the influence of no sedative(s), amnesic(s), or anxiolytics, the patient was informed of the treatment options, risks and possible complications. To fulfill our ethical and legal obligations, as recommended by the American Medical Association's Code of Ethics, I have informed the patient of my clinical impression; the nature and purpose of the  treatment or procedure; the risks, benefits, and possible complications of the intervention; the alternatives, including doing nothing; the risk(s) and benefit(s)  of the alternative treatment(s) or procedure(s); and the risk(s) and benefit(s) of doing nothing. The patient was provided information about the general risks and possible complications associated with the procedure. These may include, but are not limited to: failure to achieve desired goals, infection, bleeding, organ or nerve damage, allergic reactions, paralysis, and death. In addition, the patient was informed of those risks and complications associated to Spine-related procedures, such as failure to decrease pain; infection (i.e.: Meningitis, epidural or intraspinal abscess); bleeding (i.e.: epidural hematoma, subarachnoid hemorrhage, or any other type of intraspinal or peri-dural bleeding); organ or nerve damage (i.e.: Any type of peripheral nerve, nerve root, or spinal cord injury) with subsequent damage to sensory, motor, and/or autonomic systems, resulting in permanent pain, numbness, and/or weakness of one or several areas of the body; allergic reactions; (i.e.: anaphylactic reaction); and/or death. Furthermore, the patient was informed of those risks and complications associated with the medications. These include, but are not limited to: allergic reactions (i.e.: anaphylactic or anaphylactoid reaction(s)); adrenal axis suppression; blood sugar elevation that in diabetics may result in ketoacidosis or comma; water retention that in patients with history of congestive heart failure may result in shortness of breath, pulmonary edema, and decompensation with resultant heart failure; weight gain; swelling or edema; medication-induced neural toxicity; particulate matter embolism and blood vessel occlusion with resultant organ, and/or nervous system infarction; and/or aseptic necrosis of one or more joints. Finally, the patient was informed that  Medicine is not an exact science; therefore, there is also the possibility of unforeseen or unpredictable risks and/or possible complications that may result in a catastrophic outcome. The patient indicated having understood very clearly. We have given the patient no guarantees and we have made no promises. Enough time was given to the patient to ask questions, all of which were answered to the patient's satisfaction. Jose Kline has indicated that he wanted to continue with the procedure. Attestation: I, the ordering provider, attest that I have discussed with the patient the benefits, risks, side-effects, alternatives, likelihood of achieving goals, and potential problems during recovery for the procedure that I have provided informed consent. Date  Time: 03/28/2023  8:10 AM   Imaging Guidance (Spinal):          Type of Imaging Technique: Fluoroscopy Guidance (Spinal) Indication(s): Assistance in needle guidance and placement for procedures requiring needle placement in or near specific anatomical locations not easily accessible without such assistance. Exposure Time: Please see nurses notes. Contrast: Before injecting any contrast, we confirmed that the patient did not have an allergy to iodine, shellfish, or radiological contrast. Once satisfactory needle placement was completed at the desired level, radiological contrast was injected. Contrast injected under live fluoroscopy. No contrast complications. See chart for type and volume of contrast used. Fluoroscopic Guidance: I was personally present during the use of fluoroscopy. "Tunnel Vision Technique" used to obtain the best possible view of the target area. Parallax error corrected before commencing the procedure. "Direction-depth-direction" technique used to introduce the needle under continuous pulsed fluoroscopy. Once target was reached, antero-posterior, oblique, and lateral fluoroscopic projection used confirm needle placement in all planes.  Images permanently stored in EMR. Interpretation: I personally interpreted the imaging intraoperatively. Adequate needle placement confirmed in multiple planes. Appropriate spread of contrast into desired area was observed. No evidence of afferent or efferent intravascular uptake. No intrathecal or subarachnoid spread observed. Permanent images saved into the patient's record.  Pre-Procedure Preparation:  Monitoring: As per clinic protocol. Respiration, ETCO2, SpO2, BP, heart rate and rhythm  monitor placed and checked for adequate function Safety Precautions: Patient was assessed for positional comfort and pressure points before starting the procedure. Time-out: I initiated and conducted the "Time-out" before starting the procedure, as per protocol. The patient was asked to participate by confirming the accuracy of the "Time Out" information. Verification of the correct person, site, and procedure were performed and confirmed by me, the nursing staff, and the patient. "Time-out" conducted as per Joint Commission's Universal Protocol (UP.01.01.01). Time: 0859 Start Time: 0859 hrs.  Description  Narrative of Procedure:          Start Time: 0859 hrs.  Technical description of procedure:  Safety Precautions: Aspiration looking for blood return was conducted prior to all injections. At no point did we inject any substances, as a needle was being advanced. No attempts were made at seeking any paresthesias. Safe injection practices and needle disposal techniques used. Medications properly checked for expiration dates. SDV (single dose vial) medications used. Description of the Procedure: Protocol guidelines were followed. The patient was placed in position over the fluoroscopy table. The target area was identified and the area prepped in the usual manner. Skin & deeper tissues infiltrated with local anesthetic. Appropriate amount of time allowed to pass for local anesthetics to take effect. The procedure  needle was then advanced to the target area. Proper needle placement secured. Negative aspiration confirmed. Solution injected in intermittent fashion, asking for systemic symptoms every 0.5cc of injectate. The needle wA removed and the area cleansed, making sure to leave some of the prepping solution back to take advantage of its long term bactericidal properties.  5 cc solution made of 2 cc of preservative-free saline, 2 cc of 0.2% ropivacaine, 1 cc of Decadron 10 mg/cc.   Vitals:   03/28/23 0815 03/28/23 0855 03/28/23 0900 03/28/23 0903  BP: 110/83 116/72 111/76 114/80  Pulse: 78     Resp: 18 12 15 16   Temp: 98.3 F (36.8 C)     TempSrc: Temporal     SpO2: 100% 99% 95% 95%  Weight: 152 lb (68.9 kg)     Height: 5\' 6"  (1.676 m)        End Time: 0901 hrs.  Post-operative Assessment:  Post-procedure Vital Signs:  Pulse/HCG Rate: 78(!) 56 Temp: 98.3 F (36.8 C) Resp: 16 BP: 114/80 SpO2: 95 %  EBL: None  Complications: No immediate post-treatment complications observed by team, or reported by patient.  Note: The patient tolerated the entire procedure well. A repeat set of vitals were taken after the procedure and the patient was kept under observation following institutional policy, for this type of procedure. Post-procedural neurological assessment was performed, showing return to baseline, prior to discharge. The patient was provided with post-procedure discharge instructions, including a section on how to identify potential problems. Should any problems arise concerning this procedure, the patient was given instructions to immediately contact us, at any time, without hesitation. In any case, we plan to contact the patient by telephone for a follow-up status report regarding this interventional procedure.  Comments:  No additional relevant information.  Plan of Care (POC)  Orders:  Orders Placed This Encounter  Procedures   DG PAIN CLINIC C-ARM 1-60 MIN NO REPORT     Intraoperative interpretation by procedural physician at [facility].    Standing Status:   Standing    Number of Occurrences:   1    Order Specific Question:   Reason for exam:    Answer:   Assistance in  needle guidance and placement for procedures requiring needle placement in or near specific anatomical locations not easily accessible without such assistance.    Medications ordered for procedure: Meds ordered this encounter  Medications   iohexol (OMNIPAQUE) 180 MG/ML injection 10 mL    Must be Myelogram-compatible. If not available, you may substitute with a water-soluble, non-ionic, hypoallergenic, myelogram-compatible radiological contrast medium.   lidocaine (XYLOCAINE) 2 % (with pres) injection 400 mg   ropivacaine (PF) 2 mg/mL (0.2%) (NAROPIN) injection 4 mL   dexamethasone (DECADRON) injection 10 mg   diazepam (VALIUM) tablet 5 mg    Make sure Flumazenil is available in the pyxis when using this medication. If oversedation occurs, administer 0.2 mg IV over 15 sec. If after 45 sec no response, administer 0.2 mg again over 1 min; may repeat at 1 min intervals; not to exceed 4 doses (1 mg)   Medications administered: We administered iohexol, lidocaine, ropivacaine (PF) 2 mg/mL (0.2%), dexamethasone, and diazepam.  See the medical record for exact dosing, route, and time of administration.  Follow-up plan:   Return in about 4 weeks (around 04/25/2023) for Post Procedure Evaluation, in person.       Caudal ESI 03/28/23    Recent Visits Date Type Provider Dept  03/08/23 Office Visit Edward Jolly, MD [facility]  Showing recent visits within past 90 days and meeting all other requirements Today's Visits Date Type Provider Dept  03/28/23 Procedure visit Edward Jolly, MD [facility]  Showing today's visits and meeting all other requirements Future Appointments No visits were found meeting these conditions. Showing future appointments within next 90  days and meeting all other requirements  Disposition: Discharge home  Discharge (Date  Time): 03/28/2023;   hrs.   Primary Care Physician: Burton Apley, MD Location: [facility] Note by: Edward Jolly, MD (TTS technology used. I apologize for any typographical errors that were not detected and corrected.) Date: 03/28/2023; Time: 9:10 AM  Disclaimer:  Medicine is not an Visual merchandiser. The only guarantee in medicine is that nothing is guaranteed. It is important to note that the decision to proceed with this intervention was based on the information collected from the patient. The Data and conclusions were drawn from the patient's questionnaire, the interview, and the physical examination. Because the information was provided in large part by the patient, it cannot be guaranteed that it has not been purposely or unconsciously manipulated. Every effort has been made to obtain as much relevant data as possible for this evaluation. It is important to note that the conclusions that lead to this procedure are derived in large part from the available data. Always take into account that the treatment will also be dependent on availability of resources and existing treatment guidelines, considered by other Pain Management Practitioners as being common knowledge and practice, at the time of the intervention. For Medico-Legal purposes, it is also important to point out that variation in procedural techniques and pharmacological choices are the acceptable norm. The indications, contraindications, technique, and results of the above procedure should only be interpreted and judged by a Board-Certified Interventional Pain Specialist with extensive familiarity and expertise in the same exact procedure and technique.

## 2023-03-28 NOTE — Progress Notes (Signed)
Safety precautions to be maintained throughout the outpatient stay will include: orient to surroundings, keep bed in low position, maintain call bell within reach at all times, provide assistance with transfer out of bed and ambulation.  

## 2023-03-29 ENCOUNTER — Telehealth: Payer: Self-pay

## 2023-03-29 NOTE — Telephone Encounter (Signed)
Post procedure follow up.  LM 

## 2023-04-25 ENCOUNTER — Ambulatory Visit
Payer: Medicare Other | Attending: Student in an Organized Health Care Education/Training Program | Admitting: Student in an Organized Health Care Education/Training Program

## 2023-04-25 ENCOUNTER — Encounter: Payer: Self-pay | Admitting: Student in an Organized Health Care Education/Training Program

## 2023-04-25 VITALS — BP 135/83 | HR 65 | Temp 98.2°F | Resp 16 | Ht 66.0 in | Wt 155.0 lb

## 2023-04-25 DIAGNOSIS — G8929 Other chronic pain: Secondary | ICD-10-CM | POA: Insufficient documentation

## 2023-04-25 DIAGNOSIS — M5416 Radiculopathy, lumbar region: Secondary | ICD-10-CM | POA: Diagnosis present

## 2023-04-25 DIAGNOSIS — M48062 Spinal stenosis, lumbar region with neurogenic claudication: Secondary | ICD-10-CM | POA: Diagnosis present

## 2023-04-25 DIAGNOSIS — Z981 Arthrodesis status: Secondary | ICD-10-CM | POA: Diagnosis present

## 2023-04-25 NOTE — Patient Instructions (Signed)

## 2023-04-25 NOTE — Progress Notes (Signed)
PROVIDER NOTE: Information contained herein reflects review and annotations entered in association with encounter. Interpretation of such information and data should be left to medically-trained personnel. Information provided to patient can be located elsewhere in the medical record under "Patient Instructions". Document created using STT-dictation technology, any transcriptional errors that may result from process are unintentional.    Patient: Jose Kline  Service Category: E/M  Provider: Edward Jolly, MD  DOB: 10-04-1951  DOS: 04/25/2023  Referring Provider: Burton Apley, MD  MRN: [id number]  Specialty: Interventional Pain Management  PCP: Burton Apley, MD  Type: Established Patient  Setting: Ambulatory outpatient    Location: Office  Delivery: Face-to-face     HPI  Mr. Jose Kline, a 72 y.o. year old male, is here today because of his Chronic radicular lumbar pain [M54.16, G89.29]. [name] primary complain today is Back Pain  Pertinent problems: [name] does not have any pertinent problems on file. Pain Assessment: Severity of Chronic pain is reported as a 5 /10. Location: Back Lower, Right, Left/down legs bilateral, pain front and back of lower legs bilateral., effects bottom of feet and all toes bilateral  (right is the worse). Onset: More than a month ago. Quality: Aching, Constant, Restless, Radiating, Discomfort, Nagging, Burning (feets like feet are in a bucket of ice water). Timing: Constant. Modifying factor(s): rest. Vitals:  height is 5\' 6"  (1.676 m) and weight is 155 lb (70.3 kg). His temperature is 98.2 F (36.8 C). His blood pressure is 135/83 and his pulse is 65. His respiration is 16 and oxygen saturation is 100%.  BMI: Estimated body mass index is 25.02 kg/m as calculated from the following:   Height as of this encounter: 5\' 6"  (1.676 m).   Weight as of this encounter: 155 lb (70.3 kg). Last encounter: 03/08/2023. Last procedure: 03/28/2023.  Reason for  encounter: post-procedure evaluation and assessment.    Post-procedure evaluation   Procedure: Caudal Epidural Steroid Injection  #1  Laterality: Midline         Level: Sacrococcygeal ligament  Imaging: Fluoroscopy-guided         Anesthesia: Local anesthesia (1-2% Lidocaine) DOS: 03/28/2023  Performed by: Edward Jolly, MD  Purpose: Diagnostic/Therapeutic Indications: Low back and lower extremity pain severe enough to impact quality of life or function. Rationale (medical necessity): procedure needed and proper for the diagnosis and/or treatment of [name] medical symptoms and needs. 1. Chronic radicular lumbar pain   2. History of lumbar spinal fusion   3. Spinal stenosis, lumbar region, with neurogenic claudication   4. Lumbar radiculopathy    NAS-11 Pain score:   Pre-procedure: 4 /10   Post-procedure: 0-No pain/10      Effectiveness:  Initial hour after procedure: 80 %  Subsequent 4-6 hours post-procedure: 80 %  Analgesia past initial 6 hours: 0 %  Ongoing improvement:  Analgesic:  0%    ROS  Constitutional: Denies any fever or chills Gastrointestinal: No reported hemesis, hematochezia, vomiting, or acute GI distress Musculoskeletal:  low back and bilateral leg pain Neurological: No reported episodes of acute onset apraxia, aphasia, dysarthria, agnosia, amnesia, paralysis, loss of coordination, or loss of consciousness  Medication Review  Aspirin-Caffeine, gabapentin, lisinopril-hydrochlorothiazide, lovastatin, omeprazole, and triamcinolone cream  History Review  Allergy: [name] is allergic to soap. Drug: [name]  reports no history of drug use. Alcohol:  reports current alcohol use. Tobacco:  reports that he has been smoking cigarettes. He started smoking about 56 years ago. He has a 75.00  pack-year smoking history. His smokeless tobacco use includes chew. Social: [name]  reports that he has been smoking cigarettes. He started smoking about 56 years  ago. He has a 75.00 pack-year smoking history. His smokeless tobacco use includes chew. He reports current alcohol use. He reports that he does not use drugs. Medical:  has a past medical history of Anxiety, Complication of anesthesia, DDD (degenerative disc disease), lumbosacral, GERD (gastroesophageal reflux disease), Headache, Hyperlipidemia, and Hypertension. Surgical: [name]  has a past surgical history that includes Spine surgery (04/2011); ir generic historical (12/25/2014); Tonsillectomy and adenoidectomy; Inguinal hernia repair (Right, 12/29/2017); Insertion of mesh (Right, 12/29/2017); Hernia repair; Eye surgery (Bilateral); Thoracic discectomy (N/A, 08/06/2020); Decompressive lumbar laminectomy level 3 (08/06/2020); and Lumbar laminectomy/decompression microdiscectomy (08/06/2020). Family: family history includes Cancer in his mother; Kidney disease in his mother.  Laboratory Chemistry Profile   Renal Lab Results  Component Value Date   BUN 19 03/23/2022   CREATININE 1.18 03/23/2022   GFRAA >60 05/14/2020   GFRNONAA >60 03/23/2022    Hepatic Lab Results  Component Value Date   AST 21 03/23/2022   ALT 11 03/23/2022   ALBUMIN 3.7 03/23/2022   ALKPHOS 78 03/23/2022   LIPASE 37 03/23/2022    Electrolytes Lab Results  Component Value Date   NA 139 03/23/2022   K 3.6 03/23/2022   CL 104 03/23/2022   CALCIUM 9.2 03/23/2022    Bone No results found for: "VD25OH", "VD125OH2TOT", "YQ0347QQ5", "ZD6387FI4", "25OHVITD1", "25OHVITD2", "25OHVITD3", "TESTOFREE", "TESTOSTERONE"  Inflammation (CRP: Acute Phase) (ESR: Chronic Phase) No results found for: "CRP", "ESRSEDRATE", "LATICACIDVEN"       Note: Above Lab results reviewed.  Recent Imaging Review  DG PAIN CLINIC C-ARM 1-60 MIN NO REPORT Fluoro was used, but no Radiologist interpretation will be provided.  Please refer to "NOTES" tab for provider progress note. Note: Reviewed        Physical Exam  General appearance: Well  nourished, well developed, and well hydrated. In no apparent acute distress Mental status: Alert, oriented x 3 (person, place, & time)       Respiratory: No evidence of acute respiratory distress Eyes: PERLA Vitals: BP 135/83   Pulse 65   Temp 98.2 F (36.8 C)   Resp 16   Ht 5\' 6"  (1.676 m)   Wt 155 lb (70.3 kg)   SpO2 100%   BMI 25.02 kg/m  BMI: Estimated body mass index is 25.02 kg/m as calculated from the following:   Height as of this encounter: 5\' 6"  (1.676 m).   Weight as of this encounter: 155 lb (70.3 kg). Ideal: Ideal body weight: 63.8 kg (140 lb 10.5 oz) Adjusted ideal body weight: 66.4 kg (146 lb 6.3 oz)  Assessment   Diagnosis Status  1. Chronic radicular lumbar pain   2. History of lumbar spinal fusion   3. Spinal stenosis, lumbar region, with neurogenic claudication   4. Lumbar radiculopathy    Persistent Persistent Persistent    Plan of Care   Unfortunately, no benefit with caudal epidural steroid injection.  Patient has a fairly complex case with a T1-L5 fusion and associated right foot drop.  I did offer the patient options for medication management including low-dose hydrocodone, trial of Butrans but he declined.  He is taking gabapentin 300 to 600 mg nightly which he states has helped him with sleep.  He states that he will think about medication management further and let us know if he is interested.  Follow-up plan:  Return for patient will call to schedule F2F appt prn.      Caudal ESI 03/28/23     Recent Visits Date Type Provider Dept  03/28/23 Procedure visit Edward Jolly, MD [facility]  03/08/23 Office Visit Edward Jolly, MD [facility]  Showing recent visits within past 90 days and meeting all other requirements Today's Visits Date Type Provider Dept  04/25/23 Office Visit Edward Jolly, MD [facility]  Showing today's visits and meeting all other requirements Future Appointments No visits were found  meeting these conditions. Showing future appointments within next 90 days and meeting all other requirements  I discussed the assessment and treatment plan with the patient. The patient was provided an opportunity to ask questions and all were answered. The patient agreed with the plan and demonstrated an understanding of the instructions.  Patient advised to call back or seek an in-person evaluation if the symptoms or condition worsens.  Duration of encounter: .  Total time on encounter, as per AMA guidelines included both the face-to-face and non-face-to-face time personally spent by the physician and/or other qualified health care professional(s) on the day of the encounter (includes time in activities that require the physician or other qualified health care professional and does not include time in activities normally performed by clinical staff). Physician's time may include the following activities when performed: Preparing to see the patient (e.g., pre-charting review of records, searching for previously ordered imaging, lab work, and nerve conduction tests) Review of prior analgesic pharmacotherapies. Reviewing PMP Interpreting ordered tests (e.g., lab work, imaging, nerve conduction tests) Performing post-procedure evaluations, including interpretation of diagnostic procedures Obtaining and/or reviewing separately obtained history Performing a medically appropriate examination and/or evaluation Counseling and educating the patient/family/caregiver Ordering medications, tests, or procedures Referring and communicating with other health care professionals (when not separately reported) Documenting clinical information in the electronic or other health record Independently interpreting results (not separately reported) and communicating results to the patient/ family/caregiver Care coordination (not separately reported)  Note by: Edward Jolly, MD Date: 04/25/2023; Time: 3:02  PM

## 2023-04-25 NOTE — Progress Notes (Signed)
Safety precautions to be maintained throughout the outpatient stay will include: orient to surroundings, keep bed in low position, maintain call bell within reach at all times, provide assistance with transfer out of bed and ambulation.  

## 2023-10-17 ENCOUNTER — Other Ambulatory Visit: Payer: Self-pay | Admitting: Student in an Organized Health Care Education/Training Program

## 2023-10-17 DIAGNOSIS — M51369 Other intervertebral disc degeneration, lumbar region without mention of lumbar back pain or lower extremity pain: Secondary | ICD-10-CM

## 2023-10-17 DIAGNOSIS — Z981 Arthrodesis status: Secondary | ICD-10-CM

## 2023-10-17 DIAGNOSIS — G894 Chronic pain syndrome: Secondary | ICD-10-CM

## 2023-10-17 DIAGNOSIS — M48062 Spinal stenosis, lumbar region with neurogenic claudication: Secondary | ICD-10-CM

## 2023-10-17 DIAGNOSIS — M5416 Radiculopathy, lumbar region: Secondary | ICD-10-CM

## 2023-10-17 DIAGNOSIS — G8929 Other chronic pain: Secondary | ICD-10-CM
